# Patient Record
Sex: Female | Born: 1990 | Race: White | Hispanic: No | Smoking: Never smoker
Health system: Southern US, Community
[De-identification: ages and names within clinical notes are randomized; demographics above are authoritative.]

## PROBLEM LIST (undated history)

## (undated) DIAGNOSIS — I639 Cerebral infarction, unspecified: Secondary | ICD-10-CM

## (undated) DIAGNOSIS — R569 Unspecified convulsions: Secondary | ICD-10-CM

## (undated) NOTE — *Deleted (*Deleted)
vEEG LTM started. Neuro notified. tested event button

---

## 2020-04-18 ENCOUNTER — Inpatient Hospital Stay (HOSPITAL_COMMUNITY)
Admission: EM | Admit: 2020-04-18 | Discharge: 2020-04-21 | DRG: 100 | Attending: Pulmonary Disease | Admitting: Pulmonary Disease

## 2020-04-18 ENCOUNTER — Emergency Department (HOSPITAL_COMMUNITY)

## 2020-04-18 ENCOUNTER — Other Ambulatory Visit: Payer: Self-pay

## 2020-04-18 ENCOUNTER — Encounter (HOSPITAL_COMMUNITY): Payer: Self-pay

## 2020-04-18 DIAGNOSIS — Z79899 Other long term (current) drug therapy: Secondary | ICD-10-CM

## 2020-04-18 DIAGNOSIS — S0990XS Unspecified injury of head, sequela: Secondary | ICD-10-CM

## 2020-04-18 DIAGNOSIS — J9601 Acute respiratory failure with hypoxia: Secondary | ICD-10-CM | POA: Diagnosis not present

## 2020-04-18 DIAGNOSIS — Z79891 Long term (current) use of opiate analgesic: Secondary | ICD-10-CM

## 2020-04-18 DIAGNOSIS — E781 Pure hyperglyceridemia: Secondary | ICD-10-CM | POA: Diagnosis present

## 2020-04-18 DIAGNOSIS — E876 Hypokalemia: Secondary | ICD-10-CM | POA: Diagnosis not present

## 2020-04-18 DIAGNOSIS — X58XXXS Exposure to other specified factors, sequela: Secondary | ICD-10-CM | POA: Diagnosis not present

## 2020-04-18 DIAGNOSIS — G40901 Epilepsy, unspecified, not intractable, with status epilepticus: Secondary | ICD-10-CM | POA: Diagnosis present

## 2020-04-18 DIAGNOSIS — R4020 Unspecified coma: Secondary | ICD-10-CM | POA: Diagnosis not present

## 2020-04-18 DIAGNOSIS — Z20822 Contact with and (suspected) exposure to covid-19: Secondary | ICD-10-CM | POA: Diagnosis not present

## 2020-04-18 DIAGNOSIS — L509 Urticaria, unspecified: Secondary | ICD-10-CM | POA: Diagnosis not present

## 2020-04-18 DIAGNOSIS — G40401 Other generalized epilepsy and epileptic syndromes, not intractable, with status epilepticus: Secondary | ICD-10-CM | POA: Diagnosis not present

## 2020-04-18 DIAGNOSIS — Z7982 Long term (current) use of aspirin: Secondary | ICD-10-CM | POA: Diagnosis not present

## 2020-04-18 LAB — BASIC METABOLIC PANEL
Anion gap: 11 (ref 5–15)
BUN: 14 mg/dL (ref 6–20)
CO2: 23 mmol/L (ref 22–32)
Calcium: 8.9 mg/dL (ref 8.9–10.3)
Chloride: 105 mmol/L (ref 98–111)
Creatinine, Ser: 0.58 mg/dL (ref 0.44–1.00)
GFR calc Af Amer: >60 mL/min (ref >60)
GFR calc non Af Amer: >60 mL/min (ref >60)
Glucose, Bld: 101 mg/dL — ABNORMAL HIGH (ref 70–99)
Potassium: 4.3 mmol/L (ref 3.5–5.1)
Sodium: 139 mmol/L (ref 135–145)

## 2020-04-18 LAB — CBC WITH DIFFERENTIAL/PLATELET
Abs Immature Granulocytes: 0.01 10*3/uL (ref 0.00–0.07)
Basophils Absolute: 0 10*3/uL (ref 0.0–0.1)
Basophils Relative: 0 %
Eosinophils Absolute: 0.1 10*3/uL (ref 0.0–0.5)
Eosinophils Relative: 1 %
HCT: 42.9 % (ref 36.0–46.0)
Hemoglobin: 14.4 g/dL (ref 12.0–15.0)
Immature Granulocytes: 0 %
Lymphocytes Relative: 30 %
Lymphs Abs: 2.3 10*3/uL (ref 0.7–4.0)
MCH: 29.4 pg (ref 26.0–34.0)
MCHC: 33.6 g/dL (ref 30.0–36.0)
MCV: 87.6 fL (ref 80.0–100.0)
Monocytes Absolute: 0.7 10*3/uL (ref 0.1–1.0)
Monocytes Relative: 9 %
Neutro Abs: 4.6 10*3/uL (ref 1.7–7.7)
Neutrophils Relative %: 60 %
Platelets: 360 10*3/uL (ref 150–400)
RBC: 4.9 MIL/uL (ref 3.87–5.11)
RDW: 12.5 % (ref 11.5–15.5)
WBC: 7.7 10*3/uL (ref 4.0–10.5)
nRBC: 0 % (ref 0.0–0.2)

## 2020-04-18 LAB — I-STAT BETA HCG BLOOD, ED (MC, WL, AP ONLY): I-stat hCG, quantitative: <5 m[IU]/mL (ref <5)

## 2020-04-18 LAB — BLOOD GAS, ARTERIAL
Acid-Base Excess: 1.6 mmol/L (ref 0.0–2.0)
Bicarbonate: 24.9 mmol/L (ref 20.0–28.0)
FIO2: 100
MECHVT: 500 mL
O2 Saturation: 99.6 %
PEEP: 5 cmH2O
Patient temperature: 99.7
RATE: 16 resp/min
pCO2 arterial: 37.6 mmHg (ref 32.0–48.0)
pH, Arterial: 7.439 (ref 7.350–7.450)
pO2, Arterial: 462 mmHg — ABNORMAL HIGH (ref 83.0–108.0)

## 2020-04-18 LAB — MAGNESIUM: Magnesium: 1.9 mg/dL (ref 1.7–2.4)

## 2020-04-18 MED ORDER — METHYLPREDNISOLONE SODIUM SUCC 125 MG IJ SOLR: 125.0000 mg | Freq: Once | INTRAMUSCULAR | Status: AC

## 2020-04-18 MED ORDER — DIPHENHYDRAMINE HCL 50 MG/ML IJ SOLN: 50.0000 mg | Freq: Once | INTRAMUSCULAR | Status: AC

## 2020-04-18 MED ORDER — LORAZEPAM 2 MG/ML IJ SOLN
INTRAMUSCULAR | Status: AC
  Filled 2020-04-18: qty 1

## 2020-04-18 MED ORDER — ROCURONIUM BROMIDE 10 MG/ML (PF) SYRINGE
PREFILLED_SYRINGE | INTRAVENOUS | Status: AC
  Administered 2020-04-18: 50 mg
  Filled 2020-04-18: qty 10

## 2020-04-18 MED ORDER — LEVETIRACETAM IN NACL 1500 MG/100ML IV SOLN: 1500.0000 mg | Freq: Once | INTRAVENOUS | Status: DC

## 2020-04-18 MED ORDER — PROPOFOL 500 MG/50ML IV EMUL
INTRAVENOUS | Status: AC
  Filled 2020-04-18: qty 50

## 2020-04-18 MED ORDER — MIDAZOLAM HCL 2 MG/2ML IJ SOLN
INTRAMUSCULAR | Status: AC
  Administered 2020-04-19: 4 mg
  Filled 2020-04-18: qty 2

## 2020-04-18 MED ORDER — FAMOTIDINE IN NACL 20-0.9 MG/50ML-% IV SOLN
20.0000 mg | Freq: Once | INTRAVENOUS | Status: AC
  Administered 2020-04-18: 20 mg via INTRAVENOUS
  Filled 2020-04-18: qty 50

## 2020-04-18 MED ORDER — DIPHENHYDRAMINE HCL 50 MG/ML IJ SOLN
INTRAMUSCULAR | Status: AC
  Administered 2020-04-18: 50 mg via INTRAVENOUS
  Filled 2020-04-18: qty 1

## 2020-04-18 MED ORDER — SUCCINYLCHOLINE CHLORIDE 200 MG/10ML IV SOSY
PREFILLED_SYRINGE | INTRAVENOUS | Status: AC
  Administered 2020-04-18: 100 mg
  Filled 2020-04-18: qty 10

## 2020-04-18 MED ORDER — ETOMIDATE 2 MG/ML IV SOLN
INTRAVENOUS | Status: AC
  Administered 2020-04-18: 20 mg
  Filled 2020-04-18: qty 10

## 2020-04-18 MED ORDER — LORAZEPAM 2 MG/ML IJ SOLN: 2.0000 mg | Freq: Once | INTRAMUSCULAR | Status: DC

## 2020-04-18 MED ORDER — METHYLPREDNISOLONE SODIUM SUCC 125 MG IJ SOLR
INTRAMUSCULAR | Status: AC
  Administered 2020-04-18: 125 mg via INTRAVENOUS
  Filled 2020-04-18: qty 2

## 2020-04-18 MED ORDER — LORAZEPAM 2 MG/ML IJ SOLN
2.0000 mg | Freq: Once | INTRAMUSCULAR | Status: AC
  Filled 2020-04-18: qty 1

## 2020-04-18 MED ORDER — LEVETIRACETAM IN NACL 500 MG/100ML IV SOLN
500.0000 mg | Freq: Once | INTRAVENOUS | Status: AC
  Administered 2020-04-18: 500 mg via INTRAVENOUS
  Filled 2020-04-18: qty 100

## 2020-04-18 MED ORDER — MIDAZOLAM HCL 2 MG/2ML IJ SOLN
INTRAMUSCULAR | Status: AC
  Administered 2020-04-18: 2 mg
  Filled 2020-04-18: qty 4

## 2020-04-18 MED ORDER — FENTANYL 2500MCG IN NS 250ML (10MCG/ML) PREMIX INFUSION
0.0000 ug/h | INTRAVENOUS | Status: DC
  Administered 2020-04-18: 25 ug/h via INTRAVENOUS

## 2020-04-18 NOTE — ED Triage Notes (Addendum)
Pt BIB [facility] in custody from jail after a reported seizure. Staff said that they witnessed "2 partial seizures that were isolated to the head." She was given her Keppra less than 1 hour ago. 20g L hand. Hx of seizures.

## 2020-04-18 NOTE — ED Provider Notes (Addendum)
[facility] DE[facility] Provider Note   CSN: [id number] Arrival date & time: 04/18/20  2132     History Chief Complaint  Patient presents with   Seizures    Mary Hines is a 29 y.o. female with a hx of seizures presents to the Emergency Department from jail after being booked several hours ago.  You reported several witnessed seizures -1 only involving the head and the other full tonic-clonic.  She was given 1000 mg of Keppra orally before arriving here.  Level 5 caveat for altered mental status and acuity of condition   The history is provided by the police and medical records (From RN with some history provided by patient prior to her seizure). No language interpreter was used.       History reviewed. No pertinent past medical history.  There are no problems to display for this patient.    OB History   No obstetric history on file.     History reviewed. No pertinent family history.  Social History   Tobacco Use   Smoking status: Not on file  Substance Use Topics   Alcohol use: Not on file   Drug use: Not on file    Home Medications Prior to Admission medications   Medication Sig Start Date End Date Taking? Authorizing Provider  aspirin EC 81 MG tablet Take 81 mg by mouth daily.   Yes [provider]  levETIRAcetam (KEPPRA) 1000 MG tablet Take 1,000 mg by mouth 2 (two) times daily.   Yes [provider]  traMADol (ULTRAM) 50 MG tablet Take 100 mg by mouth every 6 (six) hours as needed.   Yes [provider]    Allergies    Ivp dye [iodinated diagnostic agents] and Penicillins  Review of Systems   Review of Systems  Unable to perform ROS: Acuity of condition  Neurological: Positive for seizures.    Physical Exam Updated Vital Signs BP 115/87    Pulse (!) 104    Temp 99.2 F (37.3 C) (Oral)    Resp 16    Ht _0  (1.626 m)    Wt 65 kg    SpO2 100%    BMI 24.60 kg/m   Physical  Exam Vitals and nursing note reviewed.  Constitutional:      General: She is in acute distress.     Appearance: She is well-developed. She is ill-appearing and diaphoretic.  HENT:     Head: Normocephalic.     Nose: Nose normal.     Mouth/Throat:     Mouth: Mucous membranes are moist.     Pharynx: Oropharynx is clear.  Eyes:     General: No scleral icterus.    Conjunctiva/sclera: Conjunctivae normal.     Comments: Right-sided eye deviation  Cardiovascular:     Rate and Rhythm: Tachycardia present.     Pulses:          Carotid pulses are 2+ on the right side and 2+ on the left side. Pulmonary:     Breath sounds: Normal breath sounds.     Comments: Patient emergently intubated - clear and equal breath sounds after intubation Abdominal:     General: Abdomen is flat. There is no distension.  Lymphadenopathy:     Cervical: No cervical adenopathy.  Skin:    General: Skin is warm.     Capillary Refill: Capillary refill takes less than 2 seconds.     Findings: Rash present.     Comments: Urticarial  rash on face, neck, upper chest, bilateral arms and bilateral legs -occurred approximately 3 to 5 minutes after administration of IV Ativan.  Neurological:     Comments: Active tonic-clonic seizure, status epilepticus     ED Results / Procedures / Treatments   Labs (all labs ordered are listed, but only abnormal results are displayed) Labs Reviewed  BASIC METABOLIC PANEL - Abnormal; Notable for the following components:      Result Value   Glucose, Bld 101 (*)    All other components within normal limits  BLOOD GAS, ARTERIAL - Abnormal; Notable for the following components:   pO2, Arterial 462 (*)    All other components within normal limits  SARS CORONAVIRUS 2 BY RT PCR (HOSPITAL ORDER, [place] [facility])  CBC WITH DIFFERENTIAL/PLATELET  MAGNESIUM  CBG MONITORING, ED  I-STAT BETA HCG BLOOD, ED ([facility], [facility], [facility] ONLY)  I-STAT CHEM 8, ED    Radiology DG Chest  Port 1 View  Result Date: 04/18/2020 CLINICAL DATA:  Status post intubation. EXAM: PORTABLE CHEST 1 VIEW COMPARISON:  None. FINDINGS: An endotracheal tube is seen with its distal tip noted just above the level of the carina. A nasogastric tube is present. Its distal and extends into the body of the stomach. There is no evidence of acute infiltrate, pleural effusion or pneumothorax. The heart size and mediastinal contours are within normal limits. The visualized skeletal structures are unremarkable. IMPRESSION: Endotracheal tube and nasogastric tube positioning, as described above. Electronically Signed   By: Virgina Norfolk M.D.   On: 04/18/2020 23:32    Procedures Procedure Name: Intubation Date/Time: 04/18/2020 11:01 PM Performed by: Abigail Butts, PA-C Oxygen Delivery Method: Non-rebreather mask Preoxygenation: Pre-oxygenation with 100% oxygen Induction Type: Rapid sequence Ventilation: Mask ventilation without difficulty Laryngoscope Size: Mac and 4 Tube size: 7.5 mm Number of attempts: 1 Airway Equipment and Method: Video-laryngoscopy Placement Confirmation: ETT inserted through vocal cords under direct vision,  CO2 detector and Breath sounds checked- equal and bilateral Secured at: 23 cm Tube secured with: ETT holder Dental Injury: Teeth and Oropharynx as per pre-operative assessment     .Critical Care Performed by: Abigail Butts, PA-C Authorized by: Abigail Butts, PA-C   Critical care provider statement:    Critical care time (minutes):  80   Critical care time was exclusive of:  Separately billable procedures and treating other patients   Critical care was necessary to treat or prevent imminent or life-threatening deterioration of the following conditions:  Respiratory failure   Critical care was time spent personally by me on the following activities:  Discussions with consultants, evaluation of patient's response to treatment, examination of patient,  ordering and performing treatments and interventions, ordering and review of laboratory studies, ordering and review of radiographic studies, pulse oximetry, re-evaluation of patient's condition, obtaining history from patient or surrogate and review of old charts   I assumed direction of critical care for this patient from another provider in my specialty: no     (including critical care time)  Medications Ordered in ED Medications  fentaNYL 2535mg in NS 2582m(1055mml) infusion-PREMIX (25 mcg/hr Intravenous New Bag/Given 04/18/20 2312)  propofol (DIPRIVAN) 500 MG/50ML infusion (has no administration in time range)  LORazepam (ATIVAN) injection 2 mg ( Intravenous Not Given 04/18/20 2323)  levETIRAcetam (KEPPRA) IVPB 500 mg/100 mL premix (500 mg Intravenous New Bag/Given 04/18/20 2318)  rocuronium bromide 100 MG/10ML SOSY (50 mg  Given 04/18/20 2258)  etomidate (AMIDATE) 2 MG/ML injection (20 mg  Given 04/18/20 2243)  propofol (DIPRIVAN) 500 MG/50ML infusion (  New Bag/Given 04/18/20 2250)  succinylcholine (ANECTINE) 200 MG/10ML syringe (100 mg  Given 04/18/20 2243)  diphenhydrAMINE (BENADRYL) injection 50 mg (50 mg Intravenous Given 04/18/20 2322)  famotidine (PEPCID) IVPB 20 mg premix (20 mg Intravenous New Bag/Given 04/18/20 2329)  methylPREDNISolone sodium succinate (SOLU-MEDROL) 125 mg/2 mL injection 125 mg (125 mg Intravenous Given 04/18/20 2321)  midazolam (VERSED) 2 MG/2ML injection (4 mg  Given 04/19/20 0000)  midazolam (VERSED) 2 MG/2ML injection (2 mg  Given 04/18/20 2345)    ED Course  I have reviewed the triage vital signs and the nursing notes.  Pertinent labs & imaging results that were available during my care of the patient were reviewed by me and considered in my medical decision making (see chart for details).    MDM Rules/Calculators/A&P                       I was called into the room due to patient seizure.  Tonic-clonic seizure activity lasting greater than 7 minutes  despite 4 mg of Ativan requiring emergent RSI (etomidate and succinylcholine) and intubation.   3 to 5 minutes after Ativan administration patient developed urticarial, bright red erythematous rash to her face, neck, chest, arms and legs.  This was not present before.  Patient additionally given Benadryl, Solu-Medrol and Pepcid.  Rash appears to be improving.  Patient sedated with diprivan and fentanyl drip. Vitals have remained stable.  Since patient was given 1000 mg of Keppra prior to arrival, she will be given additional 500 mg IV here.  BP 115/87    Pulse (!) 104    Temp 99.2 F (37.3 C) (Oral)    Resp 16    Ht _0  (1.626 m)    Wt 65 kg    SpO2 100%    BMI 24.60 kg/m   Patient intubated without complication with Dr. Darl Householder at bedside.  Consult with neurology, Dr. Cheral Marker who request patient be transferred emergently to [facility] for EEG.  He will evaluate when she arrives.  Consult with Zacarias Pontes, EDP Dr. Reather Converse who accepts in transfer.  Consult with PCCM who is aware of patient's transfer, intubation and need to be admitted upon her arrival to [facility].  11:35 PM Patient with additional seizure-like activity.  5 mg of Versed given IV with some improvement.  Diprivan sedation increased.  Initial labs were reassuring.   Chest x-ray shows ET tube just above the carina however somewhat deep.  Tube pulled back 1 cm.  [facility] arrived to transport patient to [facility] prior to head CT.  Head CT will need to be accomplished when she arrives at [facility].  The patient was discussed with and seen by Dr. Darl Householder who agrees with the treatment plan.   Final Clinical Impression(s) / ED Diagnoses Final diagnoses:  Status epilepticus [facility])    Rx / DC Orders ED Discharge Orders    None         Ryelynn Guedea, Gwenlyn Perking 04/19/20 0014    Drenda Freeze, MD 04/21/20 [phone]

## 2020-04-18 NOTE — Progress Notes (Signed)
Report called to charge at [facility], [place].  Patient being transferred to ER for head CT and admission.

## 2020-04-18 NOTE — Progress Notes (Signed)
ETT pulled back to 22 at teeth per order.

## 2020-04-19 ENCOUNTER — Emergency Department (HOSPITAL_COMMUNITY)

## 2020-04-19 ENCOUNTER — Inpatient Hospital Stay (HOSPITAL_COMMUNITY)

## 2020-04-19 DIAGNOSIS — Z79891 Long term (current) use of opiate analgesic: Secondary | ICD-10-CM | POA: Diagnosis not present

## 2020-04-19 DIAGNOSIS — G40901 Epilepsy, unspecified, not intractable, with status epilepticus: Secondary | ICD-10-CM | POA: Diagnosis present

## 2020-04-19 DIAGNOSIS — L509 Urticaria, unspecified: Secondary | ICD-10-CM | POA: Diagnosis present

## 2020-04-19 DIAGNOSIS — S0990XS Unspecified injury of head, sequela: Secondary | ICD-10-CM | POA: Diagnosis not present

## 2020-04-19 DIAGNOSIS — Z79899 Other long term (current) drug therapy: Secondary | ICD-10-CM | POA: Diagnosis not present

## 2020-04-19 DIAGNOSIS — E876 Hypokalemia: Secondary | ICD-10-CM | POA: Diagnosis not present

## 2020-04-19 DIAGNOSIS — R0689 Other abnormalities of breathing: Secondary | ICD-10-CM | POA: Insufficient documentation

## 2020-04-19 DIAGNOSIS — R4020 Unspecified coma: Secondary | ICD-10-CM | POA: Diagnosis not present

## 2020-04-19 DIAGNOSIS — E781 Pure hyperglyceridemia: Secondary | ICD-10-CM | POA: Diagnosis present

## 2020-04-19 DIAGNOSIS — Z20822 Contact with and (suspected) exposure to covid-19: Secondary | ICD-10-CM | POA: Diagnosis present

## 2020-04-19 DIAGNOSIS — Z7982 Long term (current) use of aspirin: Secondary | ICD-10-CM | POA: Diagnosis not present

## 2020-04-19 DIAGNOSIS — X58XXXS Exposure to other specified factors, sequela: Secondary | ICD-10-CM | POA: Diagnosis present

## 2020-04-19 DIAGNOSIS — G40401 Other generalized epilepsy and epileptic syndromes, not intractable, with status epilepticus: Secondary | ICD-10-CM | POA: Diagnosis present

## 2020-04-19 DIAGNOSIS — J9601 Acute respiratory failure with hypoxia: Secondary | ICD-10-CM | POA: Diagnosis present

## 2020-04-19 LAB — CBC
HCT: 38.5 % (ref 36.0–46.0)
Hemoglobin: 12.8 g/dL (ref 12.0–15.0)
MCH: 29.2 pg (ref 26.0–34.0)
MCHC: 33.2 g/dL (ref 30.0–36.0)
MCV: 87.7 fL (ref 80.0–100.0)
Platelets: 298 10*3/uL (ref 150–400)
RBC: 4.39 MIL/uL (ref 3.87–5.11)
RDW: 12.2 % (ref 11.5–15.5)
WBC: 6 10*3/uL (ref 4.0–10.5)
nRBC: 0 % (ref 0.0–0.2)

## 2020-04-19 LAB — RAPID URINE DRUG SCREEN, HOSP PERFORMED
Amphetamines: POSITIVE — AB
Barbiturates: NOT DETECTED
Benzodiazepines: POSITIVE — AB
Cocaine: NOT DETECTED
Opiates: NOT DETECTED
Tetrahydrocannabinol: POSITIVE — AB

## 2020-04-19 LAB — HIV ANTIBODY (ROUTINE TESTING W REFLEX): HIV Screen 4th Generation wRfx: NONREACTIVE

## 2020-04-19 LAB — CREATININE, SERUM
Creatinine, Ser: 0.53 mg/dL (ref 0.44–1.00)
GFR calc Af Amer: >60 mL/min (ref >60)
GFR calc non Af Amer: >60 mL/min (ref >60)

## 2020-04-19 LAB — MRSA PCR SCREENING: MRSA by PCR: NEGATIVE

## 2020-04-19 LAB — SARS CORONAVIRUS 2 BY RT PCR (HOSPITAL ORDER, PERFORMED IN ~~LOC~~ HOSPITAL LAB): SARS Coronavirus 2: NEGATIVE

## 2020-04-19 LAB — TRIGLYCERIDES: Triglycerides: 553 mg/dL — ABNORMAL HIGH (ref <150)

## 2020-04-19 MED ORDER — POLYETHYLENE GLYCOL 3350 17 G PO PACK: 17.0000 g | PACK | Freq: Every day | ORAL | Status: DC | PRN

## 2020-04-19 MED ORDER — DOCUSATE SODIUM 50 MG/5ML PO LIQD: 100.0000 mg | Freq: Two times a day (BID) | ORAL | Status: DC

## 2020-04-19 MED ORDER — PROPOFOL 1000 MG/100ML IV EMUL
INTRAVENOUS | Status: AC
  Filled 2020-04-19: qty 100

## 2020-04-19 MED ORDER — PROPOFOL 1000 MG/100ML IV EMUL
0.0000 ug/kg/min | INTRAVENOUS | Status: DC
  Administered 2020-04-19: 20 ug/kg/min via INTRAVENOUS
  Administered 2020-04-20: 40 ug/kg/min via INTRAVENOUS
  Administered 2020-04-20: 20 ug/kg/min via INTRAVENOUS
  Filled 2020-04-19 (×2): qty 100

## 2020-04-19 MED ORDER — FENTANYL CITRATE (PF) 100 MCG/2ML IJ SOLN: 50.0000 ug | Freq: Once | INTRAMUSCULAR | Status: DC

## 2020-04-19 MED ORDER — LORAZEPAM 2 MG/ML IJ SOLN
INTRAMUSCULAR | Status: AC
  Administered 2020-04-19: 2 mg
  Filled 2020-04-19: qty 1

## 2020-04-19 MED ORDER — FENTANYL 2500MCG IN NS 250ML (10MCG/ML) PREMIX INFUSION
50.0000 ug/h | INTRAVENOUS | Status: DC
  Administered 2020-04-19: 50 ug/h via INTRAVENOUS

## 2020-04-19 MED ORDER — DOCUSATE SODIUM 50 MG/5ML PO LIQD: 100.0000 mg | Freq: Two times a day (BID) | ORAL | Status: DC | PRN

## 2020-04-19 MED ORDER — PANTOPRAZOLE SODIUM 40 MG IV SOLR
40.0000 mg | Freq: Every day | INTRAVENOUS | Status: DC
  Administered 2020-04-19 – 2020-04-20 (×2): 40 mg via INTRAVENOUS
  Filled 2020-04-19 (×2): qty 40

## 2020-04-19 MED ORDER — FENTANYL BOLUS VIA INFUSION
50.0000 ug | INTRAVENOUS | Status: DC | PRN
  Filled 2020-04-19: qty 50

## 2020-04-19 MED ORDER — PROPOFOL 1000 MG/100ML IV EMUL
0.0000 ug/kg/min | INTRAVENOUS | Status: DC
  Administered 2020-04-19: 40 ug/kg/min via INTRAVENOUS

## 2020-04-19 MED ORDER — POLYETHYLENE GLYCOL 3350 17 G PO PACK: 17.0000 g | PACK | Freq: Every day | ORAL | Status: DC

## 2020-04-19 MED ORDER — CHLORHEXIDINE GLUCONATE CLOTH 2 % EX PADS
6.0000 | MEDICATED_PAD | Freq: Every day | CUTANEOUS | Status: DC
  Administered 2020-04-20 (×2): 6 via TOPICAL

## 2020-04-19 MED ORDER — MIDAZOLAM HCL 2 MG/2ML IJ SOLN
2.0000 mg | INTRAMUSCULAR | Status: DC | PRN
  Administered 2020-04-20: 2 mg via INTRAVENOUS
  Filled 2020-04-19: qty 2

## 2020-04-19 MED ORDER — LEVETIRACETAM IN NACL 1000 MG/100ML IV SOLN
1000.0000 mg | Freq: Two times a day (BID) | INTRAVENOUS | Status: DC
  Administered 2020-04-19 – 2020-04-21 (×6): 1000 mg via INTRAVENOUS
  Filled 2020-04-19 (×6): qty 100

## 2020-04-19 MED ORDER — ORAL CARE MOUTH RINSE
15.0000 mL | OROMUCOSAL | Status: DC
  Administered 2020-04-19 – 2020-04-20 (×11): 15 mL via OROMUCOSAL

## 2020-04-19 MED ORDER — DOCUSATE SODIUM 100 MG PO CAPS: 100.0000 mg | ORAL_CAPSULE | Freq: Two times a day (BID) | ORAL | Status: DC | PRN

## 2020-04-19 MED ORDER — ONDANSETRON HCL 4 MG/2ML IJ SOLN
4.0000 mg | Freq: Four times a day (QID) | INTRAMUSCULAR | Status: DC | PRN
  Administered 2020-04-20: 4 mg via INTRAVENOUS

## 2020-04-19 MED ORDER — MIDAZOLAM HCL 2 MG/2ML IJ SOLN: 2.0000 mg | INTRAMUSCULAR | Status: DC | PRN

## 2020-04-19 MED ORDER — HEPARIN SODIUM (PORCINE) 5000 UNIT/ML IJ SOLN
5000.0000 [IU] | Freq: Three times a day (TID) | INTRAMUSCULAR | Status: DC
  Administered 2020-04-19 – 2020-04-21 (×6): 5000 [IU] via SUBCUTANEOUS
  Filled 2020-04-19 (×7): qty 1

## 2020-04-19 MED ORDER — CHLORHEXIDINE GLUCONATE 0.12% ORAL RINSE (MEDLINE KIT)
15.0000 mL | Freq: Two times a day (BID) | OROMUCOSAL | Status: DC
  Administered 2020-04-19 – 2020-04-20 (×3): 15 mL via OROMUCOSAL

## 2020-04-19 MED ORDER — DOCUSATE SODIUM 50 MG/5ML PO LIQD
100.0000 mg | Freq: Two times a day (BID) | ORAL | Status: DC
  Filled 2020-04-19: qty 10

## 2020-04-19 NOTE — Progress Notes (Signed)
Attempted to wean patient per MD. Patient RR less than 8 on 12/5. Will attempt again once sedation is off longer.

## 2020-04-19 NOTE — Progress Notes (Signed)
1444 RN at bedside, pt eyes noted to roll back and all over slight shaking. MD Agarwala on unit to bedside to evaluate.  Paged MD Aroor to make aware.

## 2020-04-19 NOTE — Progress Notes (Signed)
Patient transported from ED Trauma A to 4N27 with no complications.

## 2020-04-19 NOTE — Procedures (Signed)
Electroencephalogram.  Continuous video EEG monitoring < 12 hrs   CPT 95718   Recording begins 04/19/2020 at 0200 Recording ends 04/19/2020 at 0700   4 hrs   Day 1  Date acquisition: [employer] 10-20 for electrode placement.  18 channels EEG with additional eyes linked to ipsilateral ears and EKG.  Continuous video monitoring.  This EEG was requested for this patient was witnessed generalized shaking and to evaluate for clinical and subclinical seizures  Background activities marked by continuous delta slowing with superimposed faster frequencies in the beta range. There was no clinical subclinical seizures. Possibly slight lack of faster frequencies across right temporal region however asymmetries fairly mild.  Otherwise no interictal epileptiform discharges present  Clinical interpretation: This 4 hours of intensive EEG monitoring with simultaneous video monitoring did not record any clinical subclinical seizures.  Background activities marked by continuous delta slowing with superimposed faster frequencies suggestive of encephalopathy of nonspecific etiologies.  Sedation status may contribute to these findings if patient is sedated.  There is a relative lack of faster frequencies in the right temporal region however asymmetry is very mild this may be suggestive of some degree of neuronal dysfunction on a structural vascular or degenerative basis in the right temporal region.  Again findings are very mild and recording is very limited. No evidence of cortical irritability.  Continuous monitoring is recommended to evaluate for clinical and subclinical seizures.  Clinical correlation is advised.

## 2020-04-19 NOTE — Progress Notes (Addendum)
Seizure activity noted of all over body shaking; 1311-1313 with downward gaze, pt extremities rigid. Floor RN pulled and gave 2mg  ativan 1312, rash of neck noted but quickly subsided. MD Agarwala paged-- order to again initiate propofol. MD Aroor paged and made aware--to review EEG and make adjustments if needed.  1318 propofol initiated @20mcg   1348 pt responding to voice and following commands

## 2020-04-19 NOTE — Progress Notes (Signed)
Stat EEG completed, results pending.  Dr Otelia Limes aware. Initiated LTM per his order. RN made aware. No skin breakdown noted.

## 2020-04-19 NOTE — Progress Notes (Signed)
NAME:  Mary Hines, MRN:  [id number], DOB:  [birth year], LOS: 0 ADMISSION DATE:  04/18/2020, CONSULTATION DATE: 04/19/2020 REFERRING MD: ED physician, CHIEF COMPLAINT: Status epilepticus   History of present illness   29 year old lady with history of seizure disorder who was being transferred from a jail to another and then started having seizure was brought into the emergency room at [facility] where she had another tonic-clonic seizure was given Ativan and was intubated for airway protection then she developed some diffuse rash requiring steroids and Benadryl. Neurology were contacted and patient was started on dipper Van fentanyl drip and was given Keppra and was advised to be transferred to [facility] for further management and EEG. Later in the night she developed another tonic-clonic seizure and was given 5 mg of IV Versed with improvement in her seizure. Patient was transferred to [facility] emergency room where we were called and I came to assess the patient. Patient was sedated with fentanyl and propofol and she is being connected to the EEG monitor. No obvious seizures. I was not able to get any history from the patient due to her current condition.  Past Medical History  Seizure disorder  Imaging: CT head: no acute intracranial finding.  EEG 5/24 > No sz seen. Slowing likely related to sedation.   INTERVAL HISTORY No acute events overnight. Remains on vent and EEG monitoring.   Objective   Blood pressure 95/64, pulse 65, temperature 98 F (36.7 C), temperature source Axillary, resp. rate 16, height 5\' 4"  (1.626 m), weight 65 kg, SpO2 100 %.    Vent Mode: PRVC FiO2 (%):  [40 %-100 %] 40 % Set Rate:  [16 bmp] 16 bmp Vt Set:  [440 mL-500 mL] 440 mL PEEP:  [5 cmH20] 5 cmH20 Plateau Pressure:  [12 cmH20-16 cmH20] 16 cmH20   Intake/Output Summary (Last 24 hours) at 04/19/2020 0946 Last data filed at 04/19/2020 0800 Gross per 24 hour  Intake 129.82 ml  Output 250 ml  Net  -120.18 ml   Filed Weights   04/18/20 2245  Weight: 65 kg    Examination: General: Young adult female on vent.  HENT: [place]/AT, PERRL, no JVD Lungs: Clear bilateral breath sounds Cardiovascular: RRR, no MRG Abdomen: Soft, NT, ND Extremities: No edema Neuro: Sedated   Assessment & Plan:   Status epilepticus: - AEDs per neuro - home Keppra - cEEG  Acute hypoxemic respiratory failure: -Full vent support -Fentanyl infusion for RASS 0 -DC propofol for WUA and hypertriglyceridemia (553) -VAP bundle -OK from neuro to wake up and wean vent.   Substance abuse: UDS positive for amphetamines, THC, and benzo(likley from inpatient admit) - Supportive care - Rehabilitation assistance at the proper juncture.   Best practice: -Heparin subcu and SCDs for DVT prophylaxis -PPI for GI prophylaxis -NPO -Full code -No family info   Labs   CBC: Recent Labs  Lab 04/18/20 2234 04/19/20 0340  WBC 7.7 6.0  NEUTROABS 4.6  --   HGB 14.4 12.8  HCT 42.9 38.5  MCV 87.6 87.7  PLT 360 298    Basic Metabolic Panel: Recent Labs  Lab 04/18/20 2234 04/18/20 2249 04/19/20 0340  NA 139  --   --   K 4.3  --   --   CL 105  --   --   CO2 23  --   --   GLUCOSE 101*  --   --   BUN 14  --   --   CREATININE 0.58  --  0.53  CALCIUM 8.9  --   --   MG  --  1.9  --    GFR: Estimated Creatinine Clearance: 90.4 mL/min (by C-G formula based on SCr of 0.53 mg/dL). Recent Labs  Lab 04/18/20 2234 04/19/20 0340  WBC 7.7 6.0    Liver Function Tests: No results for input(s): AST, ALT, ALKPHOS, BILITOT, PROT, ALBUMIN in the last 168 hours. No results for input(s): LIPASE, AMYLASE in the last 168 hours. No results for input(s): AMMONIA in the last 168 hours.  ABG    Component Value Date/Time   PHART 7.439 04/18/2020 2328   PCO2ART 37.6 04/18/2020 2328   PO2ART 462 (H) 04/18/2020 2328   HCO3 24.9 04/18/2020 2328   O2SAT 99.6 04/18/2020 2328     Coagulation Profile: No results for  input(s): INR, PROTIME in the last 168 hours.  Cardiac Enzymes: No results for input(s): CKTOTAL, CKMB, CKMBINDEX, TROPONINI in the last 168 hours.  HbA1C: No results found for: HGBA1C  CBG: No results for input(s): GLUCAP in the last 168 hours.  Review of Systems:   Could not be obtained due to sedation  Past Medical History  She,  has no past medical history on file.    Social History    Could not be obtained due to sedation  Family History   Her family history is not on file. Could not be obtained due to sedation  Allergies Allergies  Allergen Reactions  . Ivp Dye [Iodinated Diagnostic Agents] Hives  . Penicillins Hives  . Ativan [Lorazepam] Hives     Home Medications  Prior to Admission medications   Medication Sig Start Date End Date Taking? Authorizing Provider  aspirin EC 81 MG tablet Take 81 mg by mouth daily.   Yes [provider]  levETIRAcetam (KEPPRA) 1000 MG tablet Take 1,000 mg by mouth 2 (two) times daily.   Yes [provider]  traMADol (ULTRAM) 50 MG tablet Take 100 mg by mouth every 6 (six) hours as needed.   Yes [provider]     Critical care time 35 mins needed due to respiratory failure on vent, status epilepticus.    Georgann Housekeeper, AGACNP-BC [place]  See Amion for personal pager PCCM on call pager [phone]  04/19/2020 9:57 AM

## 2020-04-19 NOTE — H&P (Signed)
NAME:  Mary Hines, MRN:  [id number], DOB:  1991-03-13, LOS: 0 ADMISSION DATE:  04/18/2020, CONSULTATION DATE: 04/19/2020 REFERRING MD: ED physician, CHIEF COMPLAINT: Status epilepticus   History of present illness   29 year old lady with history of seizure disorder who was being transferred from a jail to another and then started having seizure was brought into the emergency room at [facility] where she had another tonic-clonic seizure was given Ativan and was intubated for airway protection then she developed some diffuse rash requiring steroids and Benadryl. Neurology were contacted and patient was started on dipper Van fentanyl drip and was given Keppra and was advised to be transferred to [facility] for further management and EEG. Later in the night she developed another tonic-clonic seizure and was given 5 mg of IV Versed with improvement in her seizure. Patient was transferred to [facility] emergency room where we were called and I came to assess the patient. Patient was sedated with fentanyl and propofol and she is being connected to the EEG monitor. No obvious seizures. I was not able to get any history from the patient due to her current condition.  Past Medical History  Seizure disorder    Objective   Blood pressure 99/62, pulse 76, temperature 98.4 F (36.9 C), temperature source Temporal, resp. rate 16, height 5\' 4"  (1.626 m), weight 65 kg, SpO2 99 %.    Vent Mode: PRVC FiO2 (%):  [100 %] 100 % Set Rate:  [16 bmp] 16 bmp Vt Set:  [440 mL-500 mL] 440 mL PEEP:  [5 cmH20] 5 cmH20 Plateau Pressure:  [12 cmH20-13 cmH20] 12 cmH20  No intake or output data in the 24 hours ending 04/19/20 0257 Filed Weights   04/18/20 2245  Weight: 65 kg    Examination: General: Sedated acutely ill on mechanical ventilation HENT: Orally intubated Lungs: Clear equal air sounds bilateral no crackles no wheezing Cardiovascular: Normal heart sound no added sounds or murmurs Abdomen: Soft  no tenderness no guarding Extremities: No edema Neuro: Sedated not following commands pupils reactive bilaterally    Assessment & Plan:   Assessment: -Status epilepticus -Acute respiratory sufficiency requiring mechanical ventilation for airway protection  Plan: -Adjust mechanical ventilation use lactic to strategy keep pulse ox above 92% -Sedated with propofol and fentanyl -Use as needed Versed if needed -Scheduled IV Keppra -We appreciate neurology follow-up and input -Continuous EEG -CT of the head -Keep n.p.o. -Heparin subcu and SCDs for DVT prophylaxis -PPI for GI prophylaxis -Full code   Labs   CBC: Recent Labs  Lab 04/18/20 2234  WBC 7.7  NEUTROABS 4.6  HGB 14.4  HCT 42.9  MCV 87.6  PLT 360    Basic Metabolic Panel: Recent Labs  Lab 04/18/20 2234 04/18/20 2249  NA 139  --   K 4.3  --   CL 105  --   CO2 23  --   GLUCOSE 101*  --   BUN 14  --   CREATININE 0.58  --   CALCIUM 8.9  --   MG  --  1.9   GFR: Estimated Creatinine Clearance: 90.4 mL/min (by C-G formula based on SCr of 0.58 mg/dL). Recent Labs  Lab 04/18/20 2234  WBC 7.7    Liver Function Tests: No results for input(s): AST, ALT, ALKPHOS, BILITOT, PROT, ALBUMIN in the last 168 hours. No results for input(s): LIPASE, AMYLASE in the last 168 hours. No results for input(s): AMMONIA in the last 168 hours.  ABG  Component Value Date/Time   PHART 7.439 04/18/2020 2328   PCO2ART 37.6 04/18/2020 2328   PO2ART 462 (H) 04/18/2020 2328   HCO3 24.9 04/18/2020 2328   O2SAT 99.6 04/18/2020 2328     Coagulation Profile: No results for input(s): INR, PROTIME in the last 168 hours.  Cardiac Enzymes: No results for input(s): CKTOTAL, CKMB, CKMBINDEX, TROPONINI in the last 168 hours.  HbA1C: No results found for: HGBA1C  CBG: No results for input(s): GLUCAP in the last 168 hours.  Review of Systems:   Could not be obtained due to sedation  Past Medical History  She,  has no past  medical history on file.    Social History    Could not be obtained due to sedation  Family History   Her family history is not on file. Could not be obtained due to sedation  Allergies Allergies  Allergen Reactions  . Ivp Dye [Iodinated Diagnostic Agents] Hives  . Penicillins Hives  . Ativan [Lorazepam] Hives     Home Medications  Prior to Admission medications   Medication Sig Start Date End Date Taking? Authorizing Provider  aspirin EC 81 MG tablet Take 81 mg by mouth daily.   Yes [provider]  levETIRAcetam (KEPPRA) 1000 MG tablet Take 1,000 mg by mouth 2 (two) times daily.   Yes [provider]  traMADol (ULTRAM) 50 MG tablet Take 100 mg by mouth every 6 (six) hours as needed.   Yes [provider]     Critical care time: I have spent 35 minutes of critical care time this time was spent at bedside or in the unit this time was exclusive any billable procedures patient is needing intensive care due to complex medical problem requiring complex medical decisions.

## 2020-04-19 NOTE — Progress Notes (Signed)
Reason for consult: Status epilepticus  Subjective: Patient is sedated and intubated.  Moves all 4 extremities to noxious stimulus.   ROS:  Unable to obtain due to poor mental status  Examination  Vital signs in last 24 hours: Temp:  [97.4 F (36.3 C)-99.2 F (37.3 C)] 98 F (36.7 C) (05/24 0800) Pulse Rate:  [64-106] 65 (05/24 0900) Resp:  [16] 16 (05/24 0900) BP: (90-117)/(61-89) 95/64 (05/24 0800) SpO2:  [98 %-100 %] 100 % (05/24 0805) FiO2 (%):  [40 %-100 %] 40 % (05/24 0805) Weight:  [65 kg] 65 kg (05/23 2245)   General: lying in bed CVS: pulse-normal rate and rhythm RS: breathing comfortably Extremities: normal   Neuro: Mental Status: Patient intubated and sedated, nonverbal and not following commands Cranial Nerves: II: Pupils are equal and reactive III,IV,VI: doll's response present V,VII: corneal reflex: Present VIII: patient does not respond to verbal stimuli IX,X: gag reflex present XI: trapezius strength unable to test bilaterally XII: tongue strength unable to test Motor: Withdraws in all 4 extremities Sensory: Withdraws to pain bilaterally Plantars: Flexor Cerebellar: Unable to perform Gait: not tested  Basic Metabolic Panel: Recent Labs  Lab 04/18/20 2234 04/18/20 2249 04/19/20 0340  NA 139  --   --   K 4.3  --   --   CL 105  --   --   CO2 23  --   --   GLUCOSE 101*  --   --   BUN 14  --   --   CREATININE 0.58  --  0.53  CALCIUM 8.9  --   --   MG  --  1.9  --     CBC: Recent Labs  Lab 04/18/20 2234 04/19/20 0340  WBC 7.7 6.0  NEUTROABS 4.6  --   HGB 14.4 12.8  HCT 42.9 38.5  MCV 87.6 87.7  PLT 360 298     Coagulation Studies: No results for input(s): LABPROT, INR in the last 72 hours.  Imaging Reviewed:     ASSESSMENT AND PLAN  29 year old female with history of seizures on Keppra presented to [facility] emergency department after having witnessed seizures while in jail.  While in the ED patient had tonic-clonic  seizure activity lasting greater than 7 minutes despite 4 mg of Ativan and patient was intubated for airway protection. Transferred to [facility], ICU-and placed on long-term EEG monitoring.  Reviewed LTM EEG-no further seizures.  Status epilepticus: Resolved  Recommendations  Wean sedation as much as possible and extubate Continue Keppra 1000 mg twice daily Seizure precautions Continue EEG while patient still intubated on sedation  CRITICAL CARE Performed by: Dara Lords Elliott Lasecki   Total critical care time: 35  minutes  Critical care time was exclusive of separately billable procedures and treating other patients.  Critical care was necessary to treat or prevent imminent or life-threatening deterioration.  Critical care was time spent personally by me on the following activities: development of treatment plan with patient and/or surrogate as well as nursing, discussions with consultants, evaluation of patient's response to treatment, examination of patient, obtaining history from patient or surrogate, ordering and performing treatments and interventions, ordering and review of laboratory studies, ordering and review of radiographic studies, pulse oximetry and re-evaluation of patient's condition.  Georgiana Spinner Navaeh Kehres [facility] Pager Number [phone] For questions after 7pm please refer to AMION to reach the Neurologist on call

## 2020-04-19 NOTE — ED Notes (Addendum)
EEG being conducted 

## 2020-04-19 NOTE — ED Provider Notes (Signed)
Patient accepted in transfer from [facility] after treatment, including intubation, for status epilepticus. On arrival, patient is appropriately sedated, BP 116/70. Per chart, she did not receive her CT head prior to transfer and will go to CT now. Dr. Otelia Limes, neuro, paged and is aware patient has arrived.  Plan: Head CT, EEG per neuro consult from [place].   2:00 - Discussed patient care with Dr. Otelia Limes. EEG being obtained now. He does not feel MRI is indicated. He is managing Keppra dosing. PCCM paged to admit.   2:20 - PCCM to see and admit.   Elpidio Anis, PA-C 04/19/20 Bufford Spikes, MD 04/19/20 [phone]

## 2020-04-19 NOTE — Procedures (Signed)
Patient Name: Ronelle Nigh  MRN: [id number]  Epilepsy Attending: Charlsie Quest  Referring Physician/Provider: Dr Caryl Pina Date: 04/19/2020  Duration: 24.24 mins  Patient history: 29 year old female with known history of seizure presented with breakthrough seizures manifesting as status epilepticus.  EEG evaluate for seizures.  Level of alertness: Comatose  AEDs during EEG study: Propofol, Keppra, Ativan  Technical aspects: This EEG study was done with scalp electrodes positioned according to the 10-20 International system of electrode placement. Electrical activity was acquired at a sampling rate of 500Hz  and reviewed with a high frequency filter of 70Hz  and a low frequency filter of 1Hz . EEG data were recorded continuously and digitally stored.   Description: EEG showed continuous generalized 3 to 5 Hz theta-delta slowing with overriding 15 to 18 Hz, 2-3 uV beta activity with irregular morphology distributed symmetrically and diffusely. Hyperventilation and photic stimulation were not performed.     ABNORMALITY -Excessive beta, generalized -Continuous slow, generalized  IMPRESSION: This study is suggestive of severe diffuse encephalopathy, nonspecific etiology but most likely related to sedation. The excessive beta activity seen in the background is most likely due to the effect of benzodiazepine and is a benign EEG pattern. No seizures or epileptiform discharges were seen throughout the recording.    Shawnta Zimbelman 

## 2020-04-19 NOTE — ED Triage Notes (Addendum)
Pt arrived by [facility] as [facility] tx for status epilepticus; intubated, propofol and fentanyl infusing. Per report, hx of seizures, is supposed to be taking keppra. Pt was transferring to another correctional facility when she started seizing. Pt reaching for tube on arrival, sedation increased. Pt was also given ativan for seizures and broke out in hives (solumedrol, benadryl, and pepcid given pta)

## 2020-04-19 NOTE — Progress Notes (Signed)
Patient awake breathing 33 times a minute when agitated. When patient calms down, RR drops below 8. Patient switched back to full support. Continuous EEG is running. RT will continue to monitor.

## 2020-04-19 NOTE — Progress Notes (Signed)
Patient transported from ED Trauma A to CT and back with no complications. 

## 2020-04-19 NOTE — Consult Note (Addendum)
NEURO HOSPITALIST CONSULT NOTE   Requesting physician: Dr. Roxanne Mins  Reason for Consult: Status epilepticus  History obtained from:  Chart    HPI:                                                                                                                                          Mary Hines is an 29 y.o. female with a history of seizures, on Keppra, who presented to the [facility] ED from jail, after having had a seizure there several hours after being booked. Staff had witnessed "2 partial seizures that were isolated to the head" as well as a full tonic-clonic seizure. She was given 1000 mg Keppra at the [place], but unclear if this was prior to or after her initial seizures.    Seizure recurred while in the ED. Per EDP note: "I was called into the room due to patient seizure.  Tonic-clonic seizure activity lasting greater than 7 minutes despite 4 mg of Ativan requiring emergent RSI (etomidate and succinylcholine) and intubation. 3 to 5 minutes after Ativan administration patient developed urticarial, bright red erythematous rash to her face, neck, chest, arms and legs.  This was not present before.  Patient additionally given Benadryl, Solu-Medrol and Pepcid.  Rash appears to be improving. Patient sedated with diprivan and fentanyl drip. Vitals have remained stable.  Since patient was given 1000 mg of Keppra prior to arrival, she will be given additional 500 mg IV here."  At 11:35 PM she had additional seizure-like activity. 5 mg of Versed given IV with some improvement.  Diprivan sedation was increased.  Initial labs were reassuring. Na 139, Ca 8.9, Mg 1.9 with normal eGFR and normal white count. HCG was < 5.   After arrival to [facility] a CT head was obtained revealing no acute intracranial findings.    Per Epic, her home meds are as follows: Keppra 1000 mg po BID ASA 81 mg po qd Ultram 100 mg po q6h PRN  History reviewed. No pertinent past medical  history.  History reviewed. No pertinent family history.            Social History:  has no history on file for tobacco, alcohol, and drug.  Allergies  Allergen Reactions  . Ivp Dye [Iodinated Diagnostic Agents] Hives  . Penicillins Hives  . Ativan [Lorazepam] Hives    HOME MEDICATIONS:  No current facility-administered medications on file prior to encounter.   Current Outpatient Medications on File Prior to Encounter  Medication Sig Dispense Refill  . aspirin EC 81 MG tablet Take 81 mg by mouth daily.    Marland Kitchen levETIRAcetam (KEPPRA) 1000 MG tablet Take 1,000 mg by mouth 2 (two) times daily.    . traMADol (ULTRAM) 50 MG tablet Take 100 mg by mouth every 6 (six) hours as needed.       ROS:                                                                                                                                       Unable to obtain due to intubation and sedation.   Blood pressure 102/61, pulse 78, temperature 98.4 F (36.9 C), temperature source Temporal, resp. rate 16, height 5' 4"  (1.626 m), weight 65 kg, SpO2 98 %.   General Examination:                                                                                                       Physical Exam  HEENT-  [place]/AT    Lungs- Intubated Extremities- No edema  Neurological Examination Mental Status: Intubated and sedated on 60 mcg/kg/hr of propofol. Will extend upper extremities to noxious stimuli. No purposeful movements. No eye opening to noxious. No attempts to communicate. Not responding to voice.  Cranial Nerves: II: No blink to threat. Pupils 2 mm and unreactive.   III,IV, VI: Eyes conjugately at the midline. No doll's eye reflex.  V,VII: Face flaccidly symmetric. Does not respond to tactile stimuli VIII: No response to voice IX,X: Intubated XI: Head is midline XII:  Intubated Motor/Sensory: Flaccid tone x 4. Extends upper extremities weakly to arm pinch. No withdrawal to plantar stimulation bilaterally.  Deep Tendon Reflexes:  2+ biceps and brachioradialis bilaterally.  3+ patellae, 2+ achilles bilaterally.  Toes mute bilaterally.  Cerebellar/Gait: Unable to assess   Lab Results: Basic Metabolic Panel: Recent Labs  Lab 04/18/20 2234 04/18/20 2249  NA 139  --   K 4.3  --   CL 105  --   CO2 23  --   GLUCOSE 101*  --   BUN 14  --   CREATININE 0.58  --   CALCIUM 8.9  --   MG  --  1.9    CBC: Recent Labs  Lab 04/18/20 2234  WBC 7.7  NEUTROABS 4.6  HGB 14.4  HCT 42.9  MCV 87.6  PLT 360  Cardiac Enzymes: No results for input(s): CKTOTAL, CKMB, CKMBINDEX, TROPONINI in the last 168 hours.  Lipid Panel: No results for input(s): CHOL, TRIG, HDL, CHOLHDL, VLDL, LDLCALC in the last 168 hours.  Imaging: DG Chest Port 1 View  Result Date: 04/18/2020 CLINICAL DATA:  Status post intubation. EXAM: PORTABLE CHEST 1 VIEW COMPARISON:  None. FINDINGS: An endotracheal tube is seen with its distal tip noted just above the level of the carina. A nasogastric tube is present. Its distal and extends into the body of the stomach. There is no evidence of acute infiltrate, pleural effusion or pneumothorax. The heart size and mediastinal contours are within normal limits. The visualized skeletal structures are unremarkable. IMPRESSION: Endotracheal tube and nasogastric tube positioning, as described above. Electronically Signed   By: Virgina Norfolk M.D.   On: 04/18/2020 23:32    Assessment: 29 year old female with known seizure history presenting with breakthrough seizures manifesting as status epilepticus 1. Clinical exam findings are consistent with propofol sedation. No lateralizing findings or clinical seizure activity seen.  2. Spot EEG reveals no electrographic seizures. Beta activity is diffusely present, consistent with benzodiazepine  administration. Diffuse slowing is noted.   Recommendations: 1. Increase scheduled Keppra to 1000 mg IV BID, first dose now (ordered).  2. STAT EEG has been completed (see above). Converting to LTM.  3. Frequent neuro checks.  4. When the patient regains consciousness, will need to be interviewed to assess for compliance vs noncompliance with home Keppra regimen. Also will need to be counseled regarding no driving until seizure free for 6 months, and other restrictions. Will need to determine if she is withdrawing from benzodiazepines or EtOH, and also will need to interview patient regarding possible illicit drug use, which could have lowered her seizure threshold.  5. Tox screen (ordered).  6. Gradually wean off propofol starting in the AM.  7. Discontinue Ultram. This medication can lower the seizure threshold.   40 minutes spent in the emergent neurological evaluation and management of this critically ill patient.   Electronically signed: Dr. Kerney Elbe 04/19/2020, 1:32 AM

## 2020-04-20 DIAGNOSIS — J9601 Acute respiratory failure with hypoxia: Secondary | ICD-10-CM

## 2020-04-20 DIAGNOSIS — R569 Unspecified convulsions: Secondary | ICD-10-CM

## 2020-04-20 LAB — BASIC METABOLIC PANEL
Anion gap: 12 (ref 5–15)
BUN: 21 mg/dL — ABNORMAL HIGH (ref 6–20)
CO2: 23 mmol/L (ref 22–32)
Calcium: 8.8 mg/dL — ABNORMAL LOW (ref 8.9–10.3)
Chloride: 107 mmol/L (ref 98–111)
Creatinine, Ser: 0.6 mg/dL (ref 0.44–1.00)
GFR calc Af Amer: >60 mL/min (ref >60)
GFR calc non Af Amer: >60 mL/min (ref >60)
Glucose, Bld: 88 mg/dL (ref 70–99)
Potassium: 4 mmol/L (ref 3.5–5.1)
Sodium: 142 mmol/L (ref 135–145)

## 2020-04-20 LAB — CBC
HCT: 37.5 % (ref 36.0–46.0)
Hemoglobin: 12.3 g/dL (ref 12.0–15.0)
MCH: 28.7 pg (ref 26.0–34.0)
MCHC: 32.8 g/dL (ref 30.0–36.0)
MCV: 87.6 fL (ref 80.0–100.0)
Platelets: 274 10*3/uL (ref 150–400)
RBC: 4.28 MIL/uL (ref 3.87–5.11)
RDW: 12.5 % (ref 11.5–15.5)
WBC: 9.8 10*3/uL (ref 4.0–10.5)
nRBC: 0 % (ref 0.0–0.2)

## 2020-04-20 LAB — TRIGLYCERIDES: Triglycerides: 124 mg/dL (ref <150)

## 2020-04-20 MED ORDER — LACTATED RINGERS IV SOLN: INTRAVENOUS | Status: DC

## 2020-04-20 MED ORDER — FENTANYL CITRATE (PF) 100 MCG/2ML IJ SOLN: 50.0000 ug | INTRAMUSCULAR | Status: DC | PRN

## 2020-04-20 MED ORDER — SODIUM CHLORIDE 0.9 % IV SOLN
400.0000 mg | Freq: Three times a day (TID) | INTRAVENOUS | Status: DC | PRN
  Filled 2020-04-20: qty 4

## 2020-04-20 MED ORDER — DEXMEDETOMIDINE HCL IN NACL 400 MCG/100ML IV SOLN
0.0000 ug/kg/h | INTRAVENOUS | Status: DC
  Administered 2020-04-20: 0.4 ug/kg/h via INTRAVENOUS

## 2020-04-20 MED ORDER — IBUPROFEN 100 MG/5ML PO SUSP
400.0000 mg | Freq: Three times a day (TID) | ORAL | Status: DC | PRN
  Administered 2020-04-20 – 2020-04-21 (×2): 400 mg via ORAL
  Filled 2020-04-20 (×2): qty 20

## 2020-04-20 NOTE — Progress Notes (Signed)
NAME:  Mary Hines, MRN:  [id number], DOB:  02-27-1991, LOS: 1 ADMISSION DATE:  04/18/2020, CONSULTATION DATE: 04/19/2020 REFERRING MD: ED physician, CHIEF COMPLAINT: Status epilepticus   History of present illness   29 year old lady with history of seizure disorder who was being transferred from a jail to another and then started having seizure was brought into the emergency room at [facility] where she had another tonic-clonic seizure was given Ativan and was intubated for airway protection then she developed some diffuse rash requiring steroids and Benadryl. Neurology were contacted and patient was started on dipper Van fentanyl drip and was given Keppra and was advised to be transferred to [facility] for further management and EEG. Later in the night she developed another tonic-clonic seizure and was given 5 mg of IV Versed with improvement in her seizure. Patient was transferred to [facility] emergency room where we were called and I came to assess the patient. Patient was sedated with fentanyl and propofol and she is being connected to the EEG monitor. No obvious seizures. I was not able to get any history from the patient due to her current condition.  Past Medical History  Seizure disorder  Imaging: CT head: no acute intracranial finding.  EEG 5/24 > No sz seen. Slowing likely related to sedation.  EEG 5/25 > no Sz  INTERVAL HISTORY Periods of agitation overnight.   Objective   Blood pressure 105/64, pulse (!) 121, temperature 99.1 F (37.3 C), temperature source Axillary, resp. rate 18, height 5\' 4"  (1.626 m), weight 65 kg, SpO2 100 %.    Vent Mode: PSV;CPAP FiO2 (%):  [40 %] 40 % Set Rate:  [16 bmp] 16 bmp Vt Set:  [440 mL] 440 mL PEEP:  [5 cmH20] 5 cmH20 Pressure Support:  [5 cmH20] 5 cmH20 Plateau Pressure:  [14 cmH20-16 cmH20] 16 cmH20   Intake/Output Summary (Last 24 hours) at 04/20/2020 0834 Last data filed at 04/20/2020 0800 Gross per 24 hour  Intake 553.5  ml  Output 875 ml  Net -321.5 ml   Filed Weights   04/18/20 2245 04/20/20 0418  Weight: 65 kg 65 kg    Examination: General:  Young adult female on vent Neuro:  Drowsy but arouses to verbal and follows commands.  HEENT:  [place]/AT, No JVD noted, PERRL Cardiovascular:  RRR, no MRG Lungs:  Clear Abdomen:  Soft, non-distended Musculoskeletal:  No acute deformity Skin:  Intact, MMM  Assessment & Plan:   Status epilepticus:resolved - Continue keppra - cEEG off today  Acute hypoxemic respiratory failure: - Full vent support - SBT today hopeful for extubation.  - DC propofol and fentanyl infusions - Start precedex.  - PRN versed and fentanyl.  - VAP bundle   Substance abuse: UDS positive for amphetamines, THC, and benzo(likley from inpatient administration) - Supportive care - Rehabilitation assistance at the proper juncture.   Best practice: -Heparin subcu and SCDs for DVT prophylaxis -PPI for GI prophylaxis -NPO -Full code -No family info   Labs   CBC: Recent Labs  Lab 04/18/20 2234 04/19/20 0340 04/20/20 0710  WBC 7.7 6.0 9.8  NEUTROABS 4.6  --   --   HGB 14.4 12.8 12.3  HCT 42.9 38.5 37.5  MCV 87.6 87.7 87.6  PLT 360 298 274    Basic Metabolic Panel: Recent Labs  Lab 04/18/20 2234 04/18/20 2249 04/19/20 0340  NA 139  --   --   K 4.3  --   --   CL 105  --   --  CO2 23  --   --   GLUCOSE 101*  --   --   BUN 14  --   --   CREATININE 0.58  --  0.53  CALCIUM 8.9  --   --   MG  --  1.9  --    GFR: Estimated Creatinine Clearance: 90.4 mL/min (by C-G formula based on SCr of 0.53 mg/dL). Recent Labs  Lab 04/18/20 2234 04/19/20 0340 04/20/20 0710  WBC 7.7 6.0 9.8    Liver Function Tests: No results for input(s): AST, ALT, ALKPHOS, BILITOT, PROT, ALBUMIN in the last 168 hours. No results for input(s): LIPASE, AMYLASE in the last 168 hours. No results for input(s): AMMONIA in the last 168 hours.  ABG    Component Value Date/Time   PHART  7.439 04/18/2020 2328   PCO2ART 37.6 04/18/2020 2328   PO2ART 462 (H) 04/18/2020 2328   HCO3 24.9 04/18/2020 2328   O2SAT 99.6 04/18/2020 2328     Coagulation Profile: No results for input(s): INR, PROTIME in the last 168 hours.  Cardiac Enzymes: No results for input(s): CKTOTAL, CKMB, CKMBINDEX, TROPONINI in the last 168 hours.  HbA1C: No results found for: HGBA1C  CBG: No results for input(s): GLUCAP in the last 168 hours.  Review of Systems:   Could not be obtained due to sedation  Past Medical History  She,  has no past medical history on file.    Social History    Could not be obtained due to sedation  Family History   Her family history is not on file. Could not be obtained due to sedation  Allergies Allergies  Allergen Reactions  . Ivp Dye [Iodinated Diagnostic Agents] Hives  . Penicillins Hives  . Ativan [Lorazepam] Hives     Home Medications  Prior to Admission medications   Medication Sig Start Date End Date Taking? Authorizing Provider  aspirin EC 81 MG tablet Take 81 mg by mouth daily.   Yes [provider]  levETIRAcetam (KEPPRA) 1000 MG tablet Take 1,000 mg by mouth 2 (two) times daily.   Yes [provider]  traMADol (ULTRAM) 50 MG tablet Take 100 mg by mouth every 6 (six) hours as needed.   Yes [provider]     Critical care time 45 mins needed due to respiratory failure on vent, status epilepticus.    Georgann Housekeeper, AGACNP-BC [place]  See Amion for personal pager PCCM on call pager [phone]  04/20/2020 8:34 AM

## 2020-04-20 NOTE — Progress Notes (Signed)
EEG LTM complete. Has un blanchable redness for head wrap. No actual skin breakdown

## 2020-04-20 NOTE — Progress Notes (Signed)
eLink Physician-Brief Progress Note Patient Name: Mary Hines DOB: 01-Feb-1991 MRN: [id number]   Date of Service  04/20/2020  HPI/Events of Note  Musculoskeletal aches in the context of recent status epilepticus. Pt is NPO s/p recent extubation.  eICU Interventions  Ibuprofen iv PRN pain x 24 hours until po intake resumes.        Thomasene Lot Ogan 04/20/2020, 8:05 PM

## 2020-04-20 NOTE — Progress Notes (Signed)
   04/20/20 2014  Vitals  Pulse Rate (!) 162  ECG Heart Rate (!) 156  Resp (!) 29  Oxygen Therapy  SpO2 99 %  MEWS Score  MEWS Temp 0  MEWS Systolic 0  MEWS Pulse 3  MEWS RR 2  MEWS LOC 0  MEWS Score 5  MEWS Score Color Red  Provider Notification  Provider Name/Title Dr. Warrick Parisian, Dr. Amada Jupiter  Date Provider Notified 04/20/20  Time Provider Notified 2030  Notification Type Call  Notification Reason Change in status ("seizure" like activity)  Response No new orders  Date of Provider Response 04/20/20  Time of Provider Response 2035    Found patient on right side full body convulsing. RN pinched trapezius muscle and patient stopped shaking/trembling and looked at [employer]. The episode lasted about 30 seconds. CCM MD and Neuro MD made aware. No new orders at this time.

## 2020-04-20 NOTE — Progress Notes (Signed)
Reason for consult:   Subjective: Patient remains intubated but awake and following commands. Patient had 1 episode of shaking captured on long-term EEG which was nonepileptic.   ROS: Limited due to patient's mental status  Examination  Vital signs in last 24 hours: Temp:  [98.5 F (36.9 C)-99.9 F (37.7 C)] 99 F (37.2 C) (05/25 0827) Pulse Rate:  [66-130] 75 (05/25 1200) Resp:  [13-36] 13 (05/25 1200) BP: (86-118)/(59-78) 105/64 (05/25 1100) SpO2:  [99 %-100 %] 100 % (05/25 1200) FiO2 (%):  [40 %] 40 % (05/25 0827) Weight:  [65 kg] 65 kg (05/25 0418)  General: lying in bed CVS: pulse-normal rate and rhythm RS: breathing comfortably Extremities: normal   Neuro: MS: Somnolent but follows commands CN: pupils equal and reactive,  EOMI, Motor: Moves all 4 extremities Reflexes:plantars: flexor Coordination: normal Gait: not tested  Basic Metabolic Panel: Recent Labs  Lab 04/18/20 2234 04/18/20 2249 04/19/20 0340 04/20/20 0710  NA 139  --   --  142  K 4.3  --   --  4.0  CL 105  --   --  107  CO2 23  --   --  23  GLUCOSE 101*  --   --  88  BUN 14  --   --  21*  CREATININE 0.58  --  0.53 0.60  CALCIUM 8.9  --   --  8.8*  MG  --  1.9  --   --     CBC: Recent Labs  Lab 04/18/20 2234 04/19/20 0340 04/20/20 0710  WBC 7.7 6.0 9.8  NEUTROABS 4.6  --   --   HGB 14.4 12.8 12.3  HCT 42.9 38.5 37.5  MCV 87.6 87.7 87.6  PLT 360 298 274     Coagulation Studies: No results for input(s): LABPROT, INR in the last 72 hours.  Imaging Reviewed:   EEG:   This study is suggestive ofmoderate to severe diffuse encephalopathy, nonspecific etiology butmostlikely related to sedation.The excessive beta activity seen in the background is most likely due to the effect of benzodiazepine and is a benign EEG pattern. No epileptiform discharges were seen throughout the recording.  Event button was pressed on 04/19/2020 at 1351 as described above without concomitant EEG  change.  However, focal motor seizures may not be seen on scalp EEG.  Therefore clinical correlation is indicated.  Another event was recorded on 04/19/2020 at 1426 as described above again without any concomitant EEG change.  During the episode patient was noted to have intermittent generalized nonrhythmic twitching/shivering-like movement.  Semiology of the episode did not seem consistent with seizure because of the intermittent nature.  Clinical correlation is suggested.  ASSESSMENT AND PLAN  29 year old female with history of seizures on Keppra presented to [facility] emergency department after having witnessed seizures while in jail.  While in the ED patient had tonic-clonic seizure activity lasting greater than 7 minutes despite 4 mg of Ativan and patient was intubated for airway protection. Transferred to [facility], ICU-and placed on long-term EEG monitoring.  Patient has 1 shaking spells captured on EEG which are nonepileptic. However unclear if this was spell was characteristic of previous events and therefore we will go ahead and continue to keep her on Keppra for now.  Reviewed LTM EEG-no further seizures. Had one nonepileptic event  Status epilepticus: Resolved Seizure versus nonepileptic events  Recommendations  DC continuous EEG Wean sedation as much as possible and extubate Continue Keppra 1000 mg twice daily Seizure precautions  Karena Addison Mende [place] [facility] Pager Number [phone] For questions after 7pm please refer to AMION to reach the Neurologist on call

## 2020-04-20 NOTE — Progress Notes (Signed)
eLink Physician-Brief Progress Note Patient Name: Mary Hines DOB: July 29, 1991 MRN: [id number]   Date of Service  04/20/2020  HPI/Events of Note  Pt had a "shaking spell" which Neurology feels is a pseudo-siezure per bedside RN, Pt is taking po.  eICU Interventions  Ibuprofen changed from iv to po.        Mary Hines 04/20/2020, 9:07 PM

## 2020-04-20 NOTE — Procedures (Signed)
Extubation Procedure Note  Patient Details:   Name: Mary Hines DOB: November 21, 1991 MRN: [id number]   Airway Documentation:    Vent end date: 04/20/20 Vent end time: 1026   Evaluation  O2 sats: stable throughout Complications: No apparent complications Patient did tolerate procedure well. Bilateral Breath Sounds: Clear  Placed on 4l/min [place] Incentive spirometer at bedside   Yes  Newt Lukes 04/20/2020, 10:26 AM

## 2020-04-20 NOTE — Procedures (Addendum)
Patient Name: Mary Hines  MRN: [id number]  Epilepsy Attending: Charlsie Quest  Referring Physician/Provider: Dr Caryl Pina Duration: 04/19/2020 0700 to 04/20/2020 0700  Patient history: 29 year old female with known history of seizure presented with breakthrough seizures manifesting as status epilepticus.  EEG evaluate for seizures.  Level of alertness: Comatose  AEDs during EEG study: Keppra  Technical aspects: This EEG study was done with scalp electrodes positioned according to the 10-20 International system of electrode placement. Electrical activity was acquired at a sampling rate of 500Hz  and reviewed with a high frequency filter of 70Hz  and a low frequency filter of 1Hz . EEG data were recorded continuously and digitally stored.   Description: EEG initially showed continuous generalized 3 to 5 Hz theta-delta slowing with overriding 15 to 18 Hz beta activity with irregular morphology distributed symmetrically and diffusely.  Sleep was characterized by vertex waves, sleep spindles (12 to 14 Hz), maximal frontocentral region.  Event button was pressed on 04/19/2020 at 1351.  On video, patient was noted to initially have left leg jerking type movement which gradually increased in frequency and amplitude and appeared to involve all of left side.  After the event ended patient was noted to be gasping while intubated.   (difficult to visualize as patient was covered under sheets and had machines obstructing camera view).  Concomitant EEG before during and after the event did not show any EEG change to suggest seizure.  Another event was recorded on 04/19/2020 at 1426 during which on video patient was intermittently noted to have generalized nonrhythmic twitching/shivering-like movement.  Concomitant EEG before during and after the episode did not show any EEG change to suggest seizure.  ABNORMALITY -Excessive beta, generalized -Continuous slow, generalized  IMPRESSION: This  study is suggestive of moderate to severe diffuse encephalopathy, nonspecific etiology but most likely related to sedation. The excessive beta activity seen in the background is most likely due to the effect of benzodiazepine and is a benign EEG pattern. No epileptiform discharges were seen throughout the recording.  Event button was pressed on 04/19/2020 at 1351 as described above without concomitant EEG change.  However, focal motor seizures may not be seen on scalp EEG.  Therefore clinical correlation is indicated.  Another event was recorded on 04/19/2020 at 1426 as described above again without any concomitant EEG change.  During the episode patient was noted to have intermittent generalized nonrhythmic twitching/shivering-like movement.  Semiology of the episode did not seem consistent with seizure because of the intermittent nature.  Clinical correlation is suggested.   Javarus Dorner 04/21/2020

## 2020-04-21 DIAGNOSIS — F445 Conversion disorder with seizures or convulsions: Secondary | ICD-10-CM

## 2020-04-21 LAB — CBC
HCT: 37.6 % (ref 36.0–46.0)
Hemoglobin: 12.4 g/dL (ref 12.0–15.0)
MCH: 28.8 pg (ref 26.0–34.0)
MCHC: 33 g/dL (ref 30.0–36.0)
MCV: 87.2 fL (ref 80.0–100.0)
Platelets: 290 10*3/uL (ref 150–400)
RBC: 4.31 MIL/uL (ref 3.87–5.11)
RDW: 12.4 % (ref 11.5–15.5)
WBC: 5 10*3/uL (ref 4.0–10.5)
nRBC: 0 % (ref 0.0–0.2)

## 2020-04-21 LAB — MAGNESIUM: Magnesium: 1.6 mg/dL — ABNORMAL LOW (ref 1.7–2.4)

## 2020-04-21 LAB — BASIC METABOLIC PANEL
Anion gap: 9 (ref 5–15)
BUN: 12 mg/dL (ref 6–20)
CO2: 26 mmol/L (ref 22–32)
Calcium: 9.1 mg/dL (ref 8.9–10.3)
Chloride: 107 mmol/L (ref 98–111)
Creatinine, Ser: 0.56 mg/dL (ref 0.44–1.00)
GFR calc Af Amer: >60 mL/min (ref >60)
GFR calc non Af Amer: >60 mL/min (ref >60)
Glucose, Bld: 103 mg/dL — ABNORMAL HIGH (ref 70–99)
Potassium: 3.2 mmol/L — ABNORMAL LOW (ref 3.5–5.1)
Sodium: 142 mmol/L (ref 135–145)

## 2020-04-21 LAB — PHOSPHORUS: Phosphorus: 3.6 mg/dL (ref 2.5–4.6)

## 2020-04-21 LAB — TRIGLYCERIDES: Triglycerides: 139 mg/dL (ref <150)

## 2020-04-21 MED ORDER — POTASSIUM CHLORIDE CRYS ER 20 MEQ PO TBCR
40.0000 meq | EXTENDED_RELEASE_TABLET | Freq: Once | ORAL | Status: AC
  Administered 2020-04-21: 40 meq via ORAL
  Filled 2020-04-21: qty 2

## 2020-04-21 MED ORDER — POLYETHYLENE GLYCOL 3350 17 G PO PACK: 17.0000 g | PACK | Freq: Every day | ORAL | Status: DC | PRN

## 2020-04-21 MED ORDER — POLYETHYLENE GLYCOL 3350 17 G PO PACK
17.0000 g | PACK | Freq: Every day | ORAL | Status: DC
  Filled 2020-04-21: qty 1

## 2020-04-21 MED ORDER — DOCUSATE SODIUM 50 MG/5ML PO LIQD: 100.0000 mg | Freq: Two times a day (BID) | ORAL | Status: DC | PRN

## 2020-04-21 MED ORDER — LEVETIRACETAM 1000 MG PO TABS: 1000.0000 mg | ORAL_TABLET | Freq: Two times a day (BID) | ORAL | 3 refills | Status: DC

## 2020-04-21 MED ORDER — MAGNESIUM SULFATE 2 GM/50ML IV SOLN
2.0000 g | Freq: Once | INTRAVENOUS | Status: AC
  Administered 2020-04-21: 2 g via INTRAVENOUS
  Filled 2020-04-21: qty 50

## 2020-04-21 MED ORDER — DOCUSATE SODIUM 50 MG/5ML PO LIQD: 100.0000 mg | Freq: Two times a day (BID) | ORAL | Status: DC

## 2020-04-21 NOTE — Progress Notes (Signed)
Reason for consult:   Subjective: Patient extubated.  Alert oriented x3 and following commands.  States that her seizures have been mostly controlled since she has been on Keppra.  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  She has had at least 2 events that are captured on EEG-there are nonepileptic events   ROS: negative except above  Examination  Vital signs in last 24 hours: Temp:  [98.3 F (36.8 C)-99 F (37.2 C)] 98.3 F (36.8 C) (05/26 0800) Pulse Rate:  [66-162] 79 (05/26 1100) Resp:  [14-29] 15 (05/26 1202) BP: (89-121)/(50-86) 110/65 (05/26 1202) SpO2:  [93 %-100 %] 100 % (05/26 1100) Weight:  [64.9 kg] 64.9 kg (05/26 0500)  General: lying in be CVS: pulse-normal rate and rhythm RS: breathing comfortably Extremities: normal   Neuro: MS: Alert, oriented, follows commands CN: pupils equal and reactive,  EOMI, face symmetric, tongue midline, normal sensation over face, Motor: 5/5 strength in all 4 extremities Reflexes: 2+ bilaterally over patella, biceps, plantars: flexor Coordination: normal Gait: not tested  Basic Metabolic Panel: Recent Labs  Lab 04/18/20 2234 04/18/20 2249 04/19/20 0340 04/20/20 0710 04/21/20 0637  NA 139  --   --  142 142  K 4.3  --   --  4.0 3.2*  CL 105  --   --  107 107  CO2 23  --   --  23 26  GLUCOSE 101*  --   --  88 103*  BUN 14  --   --  21* 12  CREATININE 0.58  --  0.53 0.60 0.56  CALCIUM 8.9  --   --  8.8* 9.1  MG  --  1.9  --   --  1.6*  PHOS  --   --   --   --  3.6    CBC: Recent Labs  Lab 04/18/20 2234 04/19/20 0340 04/20/20 0710 04/21/20 0637  WBC 7.7 6.0 9.8 5.0  NEUTROABS 4.6  --   --   --   HGB 14.4 12.8 12.3 12.4  HCT 42.9 38.5 37.5 37.6  MCV 87.6 87.7 87.6 87.2  PLT 360 298 274 290     Coagulation Studies: No results for input(s): LABPROT, INR in the last 72 hours.  Imaging Reviewed:     ASSESSMENT AND PLAN  29 year old female apparently diagnosed to have  seizure disorder 3 years ago after head trauma currently on Keppra.  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  Has not seen a neurologist in several years due to lack of insurance.  Not compliant with Keppra due to affordability (tells me it costs her 1100 per month).  Unfortunately records are not available of prior diagnosis of seizures.   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  -Outpatient neurology follow-up in 2 to 4 weeks, consider tapering levetiracetam and replacing it with lamotrigine as this can help stabilize mood and treat nonepileptic events as well as function has an antiepileptic agent in case if she were to have seizures. -Seizure precautions -Cognitive behavior therapy   Georgiana Spinner Ahlana Slaydon [facility] Pager Number [phone] For questions after 7pm please refer to AMION to reach the Neurologist on call

## 2020-04-21 NOTE — Progress Notes (Signed)
   NAME:  Mary Hines, MRN:  [id number], DOB:  1990-12-13, LOS: 2 ADMISSION DATE:  04/18/2020, CONSULTATION DATE: 04/19/2020 REFERRING MD: ED physician, CHIEF COMPLAINT: Status epilepticus   History of present illness   29 year old lady with history of seizure disorder who was being transferred from a jail to another and then started having seizure was brought into the emergency room at [facility] where she had another tonic-clonic seizure was given Ativan and was intubated for airway protection then she developed some diffuse rash requiring steroids and Benadryl. Neurology were contacted and patient was started on diprivan and fentanyl drip and was given Keppra and was advised to be transferred to [facility] for further management and EEG. Later in the night she developed another tonic-clonic seizure and was given 5 mg of IV Versed with improvement in her seizure. Patient was transferred to [facility] emergency room where we were called and I came to assess the patient. Patient was sedated with fentanyl and propofol and she is being connected to the EEG monitor. No obvious seizures. I was not able to get any history from the patient due to her current condition.  Past Medical History  Seizure disorder  Imaging: CT head: no acute intracranial finding.  EEG 5/24 > No sz seen. Slowing likely related to sedation.  EEG 5/25 > no Sz  INTERVAL HISTORY Periods of agitation overnight.   Objective   Blood pressure 105/70, pulse 81, temperature 98.8 F (37.1 C), temperature source Axillary, resp. rate 16, height 5\' 4"  (1.626 m), weight 64.9 kg, SpO2 100 %.    Vent Mode: PSV;CPAP FiO2 (%):  [40 %] 40 % PEEP:  [5 cmH20] 5 cmH20 Pressure Support:  [5 cmH20] 5 cmH20   Intake/Output Summary (Last 24 hours) at 04/21/2020 0818 Last data filed at 04/21/2020 0700 Gross per 24 hour  Intake 1656.91 ml  Output --  Net 1656.91 ml   Filed Weights   04/18/20 2245 04/20/20 0418 04/21/20 0500   Weight: 65 kg 65 kg 64.9 kg    Examination: General: Young adult female, resting in bed, in NAD. Neuro: Awake, follows commands, moves all extremities. HEENT: [place]/AT. Sclerae anicteric. EOMI. Cardiovascular: RRR, no M/R/G.  Lungs: Respirations even and unlabored.  CTA bilaterally, No W/R/R. Abdomen: BS x 4, soft, NT/ND.  Musculoskeletal: No gross deformities, no edema.  Skin: Intact, warm, no rashes.   Assessment & Plan:   Status epilepticus: resolved. Unclear whether she had true seizures (no epileptiform activity captures on EEG).  Question pseudoseizures. - Continue keppra 1000mg  BID.  Acute hypoxemic respiratory failure - resolved following extubation 5/25. - No further interventions required.  Substance abuse: UDS positive for amphetamines, THC, and benzo (likley from inpatient administration). - Supportive care - Rehabilitation assistance at the proper juncture.   Hypokalemia. - 40 mEq K PO.  Hypomagnesemia.  - 2g Mag.   Stable.  Can likely discharge this afternoon.   , 6/25 [facility] 04/21/2020, 8:23 AM

## 2020-04-21 NOTE — Plan of Care (Signed)
Pt given all handouts and discharge paperwork. All questions answered.

## 2020-04-21 NOTE — Discharge Summary (Signed)
Physician Discharge Summary   Patient ID: Mary Hines MRN: [id number] DOB/AGE: [birth year] 29 y.o.  Admit date: 04/18/2020 Discharge date: 04/21/2020                     Discharge Plan by Diagnosis   Hx underlying seizure disorder with concern for pseudo-seizures this admission (EEG neg x 2). - Continue levetiracetam 1010m BID indefinitely. - She will need neurology follow up in the next 2 - 3 weeks. - Consider psychiatry evaluation.  Substance abuse - UDS positive for amphetamines, THC, benzos (did receive benzo's in ED). - Substance abuse counseling.   Discharge Summary  Mary Hines is a 29y.o. y/o female with a PMH of seizure disorder.  [facility]  [facility]  She was given ativan and was intubated.  Post intubation, she developed a rash and required steroids and benadryl.  [facility]  [facility] was negative.  UDS positive for amphetamines, THC, benzos (did receive benzo's in ED).  [facility]  There was concern for pseudoseizures given no epileptiform activity captured on EEG.  On 5/25, she was liberated from the ventilator.  On 5/26, she was deemed medically stable and [facility]  She is being discharged on Levetiracetam 10069mBID.  She is to have follow up with neurology in the next 2 - 3 weeks.  Neurology also mentioned that it would be reasonable to have her be seen by psychiatry to determine whether she could benefit from any therapy for pseudoseizures.         Significant Hospital Events   [facility]06[facility]07-24 [facility] 5/26 > discharged.  Significant Diagnostic Studies  CT head 5/06-07-24 neg. EEG 5/May 04, 2023 neg. EEG 5/25 >  neg.  Micro Data  COVID 04/01/06/24 neg.  Antimicrobials  None.  Consults  Neurology.  Objective:  Blood pressure 109/80, pulse 89, temperature 98.3 F (36.8 C), temperature source Oral, resp. rate (!) 22, height _0  (1.626 m), weight 64.9 kg, SpO2 99 %.        Intake/Output Summary (Last 24 hours) at 04/21/2020 1049 Last data filed at 04/21/2020 0700 Gross per 24 hour  Intake 1656.91 ml  Output --  Net 1656.91 ml   Filed Weights   04/18/20 2245 04/20/20 0418 04/21/20 0500  Weight: 65 kg 65 kg 64.9 kg    Physical Examination: General: Young adult female, resting in bed, in NAD. Neuro: A&O x 3, non-focal.  HEENT: [place]/AT. EOMI, sclerae anicteric. Cardiovascular: RRR, no M/R/G.  Lungs: Respirations even and unlabored.  CTA bilaterally, No W/R/R. Abdomen: BS x 4, soft, NT/ND.  Musculoskeletal: No gross deformities, no edema.  Skin: Intact, warm, no rashes.   Discharge Labs:  BMET Recent Labs  Lab 04/18/20 2234 04/18/20 2234 04/18/20 2249 [id number] 04/20/20 0710 04/21/20 0637  NA 139  --   --   --  142 142  K 4.3   < >  --   --  4.0 3.2*  CL 105  --   --   --  107 107  CO2 23  --   --   --  23 26  GLUCOSE 101*  --   --   --  88 103*  BUN 14  --   --   --  21* 12  CREATININE 0.58  --   --  0.53 0.60 0.56  CALCIUM 8.9  --   --   --  8.8* 9.1  MG  --   --  1.9  --   --  1.6*  PHOS  --   --   --   --   --  3.6   < > = values in this interval not displayed.    CBC Recent Labs  Lab 04/19/20 0340 04/20/20 0710 04/21/20 0637  HGB 12.8 12.3 12.4  HCT 38.5 37.5 37.6  WBC 6.0 9.8 5.0  PLT 298 274 290    Anti-Coagulation No results for input(s): INR in the last 168 hours.  Discharge Instructions    Diet - low sodium heart healthy   Complete by: As directed    Increase activity slowly   Complete by: As directed       Allergies as of 04/21/2020      Reactions   Ivp Dye [iodinated Diagnostic Agents] Hives, Rash   Pharyngitis   Penicillins Hives,  Rash   Pharyngitis   Ativan [lorazepam] Hives   Pt said she is not allergic to Ativan on 04/21/20   Shellfish Allergy Hives, Rash   Pharyngitis      Medication List    TAKE these medications   aspirin EC 81 MG tablet Take 81 mg by mouth daily.   levETIRAcetam 1000 MG tablet Commonly known as: KEPPRA Take 1 tablet (1,000 mg total) by mouth 2 (two) times daily.   traMADol 50 MG tablet Commonly known as: ULTRAM Take 100 mg by mouth every 6 (six) hours as needed.        Disposition: [facility]   Discharge Condition:  Mary Hines has met maximum benefit of inpatient care and is medically stable and cleared for discharge.  Patient is pending follow up as above.     Time spent on discharge: Greater than 30 minutes.   Montey Hora, [facility] 04/21/2020, 10:49 AM

## 2020-10-25 ENCOUNTER — Emergency Department (HOSPITAL_COMMUNITY)
Admission: EM | Admit: 2020-10-25 | Discharge: 2020-10-25 | Disposition: A | Discharge status: Home / Self Care | Source: Home / Self Care | Attending: Emergency Medicine | Admitting: Emergency Medicine

## 2020-10-25 DIAGNOSIS — F445 Conversion disorder with seizures or convulsions: Secondary | ICD-10-CM

## 2020-10-25 DIAGNOSIS — R569 Unspecified convulsions: Secondary | ICD-10-CM | POA: Insufficient documentation

## 2020-10-25 LAB — COMPREHENSIVE METABOLIC PANEL
ALT: 49 U/L — ABNORMAL HIGH (ref 0–44)
AST: 23 U/L (ref 15–41)
Albumin: 4.3 g/dL (ref 3.5–5.0)
Alkaline Phosphatase: 50 U/L (ref 38–126)
Anion gap: 11 (ref 5–15)
BUN: 12 mg/dL (ref 6–20)
CO2: 23 mmol/L (ref 22–32)
Calcium: 8.9 mg/dL (ref 8.9–10.3)
Chloride: 104 mmol/L (ref 98–111)
Creatinine, Ser: 0.53 mg/dL (ref 0.44–1.00)
GFR, Estimated: >60 mL/min (ref >60)
Glucose, Bld: 103 mg/dL — ABNORMAL HIGH (ref 70–99)
Potassium: 3.5 mmol/L (ref 3.5–5.1)
Sodium: 138 mmol/L (ref 135–145)
Total Bilirubin: 0.4 mg/dL (ref 0.3–1.2)
Total Protein: 7.7 g/dL (ref 6.5–8.1)

## 2020-10-25 LAB — CBC WITH DIFFERENTIAL/PLATELET
Abs Immature Granulocytes: 0.02 10*3/uL (ref 0.00–0.07)
Basophils Absolute: 0 10*3/uL (ref 0.0–0.1)
Basophils Relative: 0 %
Eosinophils Absolute: 0 10*3/uL (ref 0.0–0.5)
Eosinophils Relative: 0 %
HCT: 39.1 % (ref 36.0–46.0)
Hemoglobin: 13.4 g/dL (ref 12.0–15.0)
Immature Granulocytes: 0 %
Lymphocytes Relative: 14 %
Lymphs Abs: 1.1 10*3/uL (ref 0.7–4.0)
MCH: 29.6 pg (ref 26.0–34.0)
MCHC: 34.3 g/dL (ref 30.0–36.0)
MCV: 86.5 fL (ref 80.0–100.0)
Monocytes Absolute: 0.6 10*3/uL (ref 0.1–1.0)
Monocytes Relative: 7 %
Neutro Abs: 6.2 10*3/uL (ref 1.7–7.7)
Neutrophils Relative %: 79 %
Platelets: 309 10*3/uL (ref 150–400)
RBC: 4.52 MIL/uL (ref 3.87–5.11)
RDW: 13.2 % (ref 11.5–15.5)
WBC: 7.9 10*3/uL (ref 4.0–10.5)
nRBC: 0 % (ref 0.0–0.2)

## 2020-10-25 LAB — I-STAT BETA HCG BLOOD, ED (MC, WL, AP ONLY): I-stat hCG, quantitative: <5 m[IU]/mL (ref <5)

## 2020-10-25 LAB — ETHANOL: Alcohol, Ethyl (B): <10 mg/dL (ref <10)

## 2020-10-25 MED ORDER — AMMONIA AROMATIC IN INHA
RESPIRATORY_TRACT | Status: AC
  Filled 2020-10-25: qty 10

## 2020-10-25 MED ORDER — LEVETIRACETAM IN NACL 1000 MG/100ML IV SOLN
1000.0000 mg | Freq: Once | INTRAVENOUS | Status: AC
  Administered 2020-10-25: 1000 mg via INTRAVENOUS
  Filled 2020-10-25: qty 100

## 2020-10-25 MED ORDER — LEVETIRACETAM 1000 MG PO TABS: 1000.0000 mg | ORAL_TABLET | Freq: Two times a day (BID) | ORAL | 1 refills | Status: AC

## 2020-10-25 NOTE — Discharge Instructions (Addendum)
1.  Continue taking Keppra 1000 mg twice daily as prescribed. 2.  Make a follow-up appointment with a neurologist as soon as possible.  You have been given contact information from [facility]. 3. NO TRAMADOL.  Tramadol can increase the likelihood of having a seizure in people who are susceptible.

## 2020-10-25 NOTE — ED Provider Notes (Addendum)
[facility]-EMERGENCY DEPT Provider Note   CSN: [id number] Arrival date & time: 10/25/20  2011     History Chief Complaint  Patient presents with  . Seizures    Mary Hines is a 29 y.o. female.  HPI Patient has reported history of seizure disorder.  She reportedly is supposed to be taking Keppra.  She does report taking her Keppra dose this morning.  Patient was under arrest for failure to present at a court order.  Was arrested, patient began having seizure activity.  She had continuous movements of face and arms.  EMS called.  EMS advises due to the appearance of seizure-like activity, patient was given Versed 10 mg IV.  Throughout transport, respirations have been stable.  Patient has not required airway support.  Oxygen saturations have been stable.  Patient is a poor historian at this time.  She is whispering in a quiet and stuttering type voice.  Intermittent shaking activity that then stops with exam and questioning.    No past medical history on file.  Patient Active Problem List   Diagnosis Date Noted  . Acute hypoxemic respiratory failure (HCC)   . Status epilepticus (HCC) 04/19/2020  . Acute respiratory insufficiency        OB History   No obstetric history on file.     No family history on file.  Social History   Tobacco Use  . Smoking status: Not on file  Substance Use Topics  . Alcohol use: Not on file  . Drug use: Not on file    Home Medications Prior to Admission medications   Medication Sig Start Date End Date Taking? Authorizing Provider  gabapentin (NEURONTIN) 300 MG capsule Take 300 mg by mouth 2 (two) times daily.   Yes [provider]  levETIRAcetam (KEPPRA) 1000 MG tablet Take 1 tablet (1,000 mg total) by mouth 2 (two) times daily. 04/21/20  Yes Desai, Rahul P, PA-C  levETIRAcetam (KEPPRA) 1000 MG tablet Take 1 tablet (1,000 mg total) by mouth 2 (two) times daily. 10/25/20   Arby Barrette, MD     Allergies    Ivp dye [iodinated diagnostic agents], Penicillins, Ativan [lorazepam], and Shellfish allergy  Review of Systems   Review of Systems All 5 caveat cannot obtain full review of systems due to patient current condition. Physical Exam Updated Vital Signs BP 97/61   Pulse 76   Temp 98.1 F (36.7 C) (Oral)   Resp 13   Ht 5\' 2"  (1.575 m)   Wt 59 kg   LMP 10/21/2020 (Approximate)   SpO2 95%   BMI 23.78 kg/m   Physical Exam Constitutional:      Comments: On arrival on EMS, patient is doing some very low amplitude facial movements and arm movements.  No respiratory distress.  Once transferred to ED stretcher, motions have stopped.  HENT:     Head: Normocephalic and atraumatic.     Nose: Nose normal.     Mouth/Throat:     Mouth: Mucous membranes are moist.     Pharynx: Oropharynx is clear.     Comments: Patient protrudes her tongue on command.  No lacerations to the tongue Eyes:     Extraocular Movements: Extraocular movements intact.     Conjunctiva/sclera: Conjunctivae normal.     Pupils: Pupils are equal, round, and reactive to light.  Cardiovascular:     Comments: Borderline tachycardia.  No rub murmur gallop Pulmonary:     Effort: Pulmonary effort is normal.  Breath sounds: Normal breath sounds.  Abdominal:     General: There is no distension.     Palpations: Abdomen is soft.     Tenderness: There is no abdominal tenderness. There is no guarding.  Musculoskeletal:        General: No swelling or tenderness. Normal range of motion.     Cervical back: Neck supple.     Right lower leg: No edema.     Left lower leg: No edema.  Skin:    General: Skin is warm and dry.  Neurological:     Comments: Patient is vacillating between some repetitive motions of her face and arms and slowly answering questions.  She will follow commands to do grip strength and open her  mouth and stick out her tongue. While patient is having episode of seizure-like movement, she  maintains normal corneal pain response and blinks.     ED Results / Procedures / Treatments   Labs (all labs ordered are listed, but only abnormal results are displayed) Labs Reviewed  COMPREHENSIVE METABOLIC PANEL - Abnormal; Notable for the following components:      Result Value   Glucose, Bld 103 (*)    ALT 49 (*)    All other components within normal limits  ETHANOL  CBC WITH DIFFERENTIAL/PLATELET  URINALYSIS, ROUTINE W REFLEX MICROSCOPIC  RAPID URINE DRUG SCREEN, HOSP PERFORMED  I-STAT BETA HCG BLOOD, ED ([facility], [facility], [facility] ONLY)    EKG None  Radiology No results found.  Procedures Procedures (including critical care time)  Medications Ordered in ED Medications  ammonia inhalant (has no administration in time range)  levETIRAcetam (KEPPRA) IVPB 1000 mg/100 mL premix (0 mg Intravenous Stopped 10/25/20 2154)    ED Course  I have reviewed the triage vital signs and the nursing notes.  Pertinent labs & imaging results that were available during my care of the patient were reviewed by me and considered in my medical decision making (see chart for details).    MDM Rules/Calculators/A&P                         EMR reviewed from prior admission.  Patient was heavily sedated.  She had 2 EEGs.  EEGs did not exhibit epileptic waveforms.  She was seen by neurology.  At that time there was some report of pseudoseizure history as well.  Due to lack of ancillary history, it was decided to continue Keppra and have patient follow-up with neurology on outpatient basis.  11: 04 patient's vital signs are normal.  No tachycardia.  She is sleeping without any signs of seizure activity.  Once awakened, patient's eye motions are normal.  No appearance of ongoing seizure activity.  She is taciturn but well in appearance.  I have reviewed case with neuro hospitalist Dr. Iver Nestle.  Review of EMR shows the patient did not have epileptic waveforms during observed seizure-like movements during her last  hospitalization.  Very high suspicion for pseudoseizure.  At this time, vital signs are stable and lab work within normal limits.  [facility] patient to jail.  Will provide prescription to continue Keppra as prescribed at discharge from last hospitalization.  Recommendation is for follow-up with neurology as soon as possible.    Final Clinical Impression(s) / ED Diagnoses Final diagnoses:  Pseudoseizure    Rx / DC Orders ED Discharge Orders         Ordered    levETIRAcetam (KEPPRA) 1000 MG tablet  2 times daily        10/25/20 2302           Arby Barrette, MD 10/25/20 0947    Arby Barrette, MD 10/25/20 [phone]

## 2020-10-25 NOTE — ED Notes (Signed)
MD notified of continued seizure-like activity.  Pt appears to be following staff with her eyes during seizure activity.  Pt is also reactive to stimulation during these episodes.  Respirations remain even and unlabored.  O2 saturation 97-99% continuously.  MD advises to continue to monitor the patient.  Will continue to monitor.

## 2020-10-25 NOTE — ED Notes (Addendum)
Officer stated that Pt has had approximately 8-10 seizures prior to arriving at the ER. Patient has had 2 seizures at 2030 & 2035 while in the ER room 22. Pt stated her last seizure was at the beginning of of the month. Each seizure is approximately 15 seconds long.

## 2020-10-25 NOTE — ED Notes (Signed)
Pt resting quietly in bed.  NO signs of seizure activity

## 2020-10-25 NOTE — ED Triage Notes (Signed)
Pt to ER via EMS from jail after having a witnessed seizure.  Per EMS, pt's symptoms were not resolved with IV Versed.  PT seizing upon arrival, but symptoms resolved upon transfer to stretcher.  Officer at bedside.  Respirations even and unlabored.  NADN.

## 2020-10-26 ENCOUNTER — Emergency Department (HOSPITAL_COMMUNITY)
Admission: EM | Admit: 2020-10-26 | Discharge: 2020-10-26 | Disposition: A | Discharge status: Home / Self Care | Source: Home / Self Care | Attending: Emergency Medicine | Admitting: Emergency Medicine

## 2020-10-26 ENCOUNTER — Other Ambulatory Visit: Payer: Self-pay

## 2020-10-26 ENCOUNTER — Encounter (HOSPITAL_COMMUNITY): Payer: Self-pay

## 2020-10-26 ENCOUNTER — Emergency Department (HOSPITAL_COMMUNITY)

## 2020-10-26 DIAGNOSIS — R569 Unspecified convulsions: Secondary | ICD-10-CM | POA: Insufficient documentation

## 2020-10-26 LAB — CBC WITH DIFFERENTIAL/PLATELET
Abs Immature Granulocytes: 0.02 10*3/uL (ref 0.00–0.07)
Basophils Absolute: 0 10*3/uL (ref 0.0–0.1)
Basophils Relative: 0 %
Eosinophils Absolute: 0 10*3/uL (ref 0.0–0.5)
Eosinophils Relative: 0 %
HCT: 40.5 % (ref 36.0–46.0)
Hemoglobin: 13.6 g/dL (ref 12.0–15.0)
Immature Granulocytes: 0 %
Lymphocytes Relative: 17 %
Lymphs Abs: 1.1 10*3/uL (ref 0.7–4.0)
MCH: 29 pg (ref 26.0–34.0)
MCHC: 33.6 g/dL (ref 30.0–36.0)
MCV: 86.4 fL (ref 80.0–100.0)
Monocytes Absolute: 0.5 10*3/uL (ref 0.1–1.0)
Monocytes Relative: 8 %
Neutro Abs: 4.9 10*3/uL (ref 1.7–7.7)
Neutrophils Relative %: 75 %
Platelets: 308 10*3/uL (ref 150–400)
RBC: 4.69 MIL/uL (ref 3.87–5.11)
RDW: 13.1 % (ref 11.5–15.5)
WBC: 6.5 10*3/uL (ref 4.0–10.5)
nRBC: 0 % (ref 0.0–0.2)

## 2020-10-26 LAB — BASIC METABOLIC PANEL
Anion gap: 12 (ref 5–15)
BUN: 11 mg/dL (ref 6–20)
CO2: 22 mmol/L (ref 22–32)
Calcium: 9.2 mg/dL (ref 8.9–10.3)
Chloride: 105 mmol/L (ref 98–111)
Creatinine, Ser: 0.6 mg/dL (ref 0.44–1.00)
GFR, Estimated: >60 mL/min (ref >60)
Glucose, Bld: 103 mg/dL — ABNORMAL HIGH (ref 70–99)
Potassium: 3.7 mmol/L (ref 3.5–5.1)
Sodium: 139 mmol/L (ref 135–145)

## 2020-10-26 LAB — CBG MONITORING, ED: Glucose-Capillary: 95 mg/dL (ref 70–99)

## 2020-10-26 LAB — I-STAT BETA HCG BLOOD, ED (MC, WL, AP ONLY): I-stat hCG, quantitative: <5 m[IU]/mL (ref <5)

## 2020-10-26 MED ORDER — METOCLOPRAMIDE HCL 5 MG/ML IJ SOLN
5.0000 mg | Freq: Once | INTRAMUSCULAR | Status: AC
  Administered 2020-10-26: 5 mg via INTRAVENOUS
  Filled 2020-10-26: qty 2

## 2020-10-26 MED ORDER — KETOROLAC TROMETHAMINE 15 MG/ML IJ SOLN
15.0000 mg | Freq: Once | INTRAMUSCULAR | Status: AC
  Administered 2020-10-26: 15 mg via INTRAMUSCULAR
  Filled 2020-10-26: qty 1

## 2020-10-26 MED ORDER — DIPHENHYDRAMINE HCL 50 MG/ML IJ SOLN
12.5000 mg | Freq: Once | INTRAMUSCULAR | Status: AC
  Administered 2020-10-26: 12.5 mg via INTRAVENOUS
  Filled 2020-10-26: qty 1

## 2020-10-26 MED ORDER — LEVETIRACETAM 500 MG PO TABS
1000.0000 mg | ORAL_TABLET | Freq: Once | ORAL | Status: AC
  Administered 2020-10-26: 1000 mg via ORAL
  Filled 2020-10-26: qty 2

## 2020-10-26 NOTE — Discharge Instructions (Signed)
At this time there does not appear to be the presence of an emergent medical condition, however there is always the potential for conditions to change. Please read and follow the below instructions.  Please return to the Emergency Department immediately for any new or worsening symptoms. Please be sure to follow up with your Primary Care Provider within one week regarding your visit today; please call their office to schedule an appointment even if you are feeling better for a follow-up visit. Please take your antiepileptic Keppra as prescribed.  Please follow-up with your neurologist.  Go to the nearest Emergency Department immediately if: You have fever or chills You have a seizure that: Lasts longer than 5 minutes. Is different than seizures you had before. Makes it harder to breathe. Happens after you hurt your head. You have any of these symptoms after a seizure: Not being able to speak. Not being able to use a part of your body. Confusion. A bad headache. You have two or more seizures in a row. You do not wake up right after a seizure. You get hurt during a seizure. You have any new/concerning or worsening of symptoms  Please read the additional information packets attached to your discharge summary.  Do not take your medicine if  develop an itchy rash, swelling in your mouth or lips, or difficulty breathing; call 911 and seek immediate emergency medical attention if this occurs.  You may review your lab tests and imaging results in their entirety on your MyChart account.  Please discuss all results of fully with your primary care provider and other specialist at your follow-up visit.  Note: Portions of this text may have been transcribed using voice recognition software. Every effort was made to ensure accuracy; however, inadvertent computerized transcription errors may still be present.

## 2020-10-26 NOTE — ED Triage Notes (Signed)
Pt arrive EMS in [facility] custody from jail, reports multiple seizures today. Non- compliant with medications, EMS states possible psuedo seizure enroute, had eyelid and pain response to stimulation during transport. Seen here yesterday for same.  BP 122/78 HR 78 RR 20 Sp02 98 RA CBG 90  22 R hand

## 2020-10-26 NOTE — ED Provider Notes (Signed)
[facility]-EMERGENCY DEPT Provider Note   CSN: [id number] Arrival date & time: 10/26/20  1458     History Chief Complaint  Patient presents with  . Seizures    Mary Hines is a 29 y.o. female presents today from jail for seizure-like activity.  EMS was told patient had multiple episodes of seizure-like activity today with shaking of all extremities.  EMS has left prior to my initial evaluation.  History obtained from nursing note as well as patient.  On triage 9.  Patient had positive painful stimuli during her seizure like activity.  Patient is somnolent on exam slow speech without dysarthria, she reports that she had a seizure today in jail she does not remember the seizures.  She reports headache today she describes as aching of her entire head no radiating no clear aggravating or alleviating factors.  She reports that she had her Keppra yesterday in the ER when she was seen here for similar symptoms but prior to her ER visit yesterday she had not had her Keppra in several months.  She also has not had any seizures since the last time she was taken to prison in May 2021.  She denies any recent illness, is unclear if she had a fall, she denies any injury aside from headache, she denies neck pain chest pain abdominal pain back pain nausea vomiting recent illness or any additional concerns.  HPI     History reviewed. No pertinent past medical history.  Patient Active Problem List   Diagnosis Date Noted  . Acute hypoxemic respiratory failure (HCC)   . Status epilepticus (HCC) 04/19/2020  . Acute respiratory insufficiency     History reviewed. No pertinent surgical history.   OB History   No obstetric history on file.     No family history on file.  Social History   Tobacco Use  . Smoking status: Never Smoker  . Smokeless tobacco: Never Used  Substance Use Topics  . Alcohol use: Not on file  . Drug use: Not on file    Home  Medications Prior to Admission medications   Medication Sig Start Date End Date Taking? Authorizing Provider  gabapentin (NEURONTIN) 300 MG capsule Take 300 mg by mouth 2 (two) times daily.    [provider]  levETIRAcetam (KEPPRA) 1000 MG tablet Take 1 tablet (1,000 mg total) by mouth 2 (two) times daily. 04/21/20   Desai, Rahul P, PA-C  levETIRAcetam (KEPPRA) 1000 MG tablet Take 1 tablet (1,000 mg total) by mouth 2 (two) times daily. 10/25/20   Arby BarrettePfeiffer, Marcy, MD    Allergies    Ivp dye [iodinated diagnostic agents], Penicillins, Ativan [lorazepam], and Shellfish allergy  Review of Systems   Review of Systems Ten systems are reviewed and are negative for acute change except as noted in the HPI  Physical Exam Updated Vital Signs BP 115/80   Pulse 80   Temp 98.7 F (37.1 C) (Oral)   Resp 17   LMP 10/21/2020 (Approximate)   SpO2 99%   Physical Exam Constitutional:      General: She is not in acute distress.    Appearance: Normal appearance. She is well-developed. She is not ill-appearing or diaphoretic.  HENT:     Head: Normocephalic and atraumatic. No raccoon eyes or Battle's sign.     Jaw: There is normal jaw occlusion.     Right Ear: External ear normal. No hemotympanum.     Left Ear: External ear normal. No hemotympanum.  Ears:     Comments: Tympanostomy scars. Eyes:     General: Vision grossly intact. Gaze aligned appropriately.     Extraocular Movements: Extraocular movements intact.     Conjunctiva/sclera: Conjunctivae normal.     Pupils: Pupils are equal, round, and reactive to light.  Neck:     Trachea: Trachea and phonation normal. No tracheal tenderness or tracheal deviation.  Cardiovascular:     Rate and Rhythm: Normal rate and regular rhythm.     Pulses:          Dorsalis pedis pulses are 2+ on the right side and 2+ on the left side.  Pulmonary:     Effort: Pulmonary effort is normal. No respiratory distress.  Abdominal:     General: There is  no distension.     Palpations: Abdomen is soft.     Tenderness: There is no abdominal tenderness. There is no guarding or rebound.  Musculoskeletal:        General: Normal range of motion.     Cervical back: Normal range of motion and neck supple.     Comments: No midline C/T/L spinal tenderness to palpation, no paraspinal muscle tenderness, no deformity, crepitus, or step-off noted.  Feet:     Right foot:     Protective Sensation: 3 sites tested. 3 sites sensed.     Left foot:     Protective Sensation: 3 sites sensed.  Skin:    General: Skin is warm and dry.  Neurological:     Mental Status: She is alert.     GCS: GCS eye subscore is 4. GCS verbal subscore is 5. GCS motor subscore is 6.     Comments: Speech is clear and goal oriented, follows commands Major Cranial nerves without deficit, no facial droop Moves extremities without ataxia, coordination intact  Psychiatric:        Behavior: Behavior normal.     ED Results / Procedures / Treatments   Labs (all labs ordered are listed, but only abnormal results are displayed) Labs Reviewed  BASIC METABOLIC PANEL - Abnormal; Notable for the following components:      Result Value   Glucose, Bld 103 (*)    All other components within normal limits  CBC WITH DIFFERENTIAL/PLATELET  CBG MONITORING, ED  I-STAT BETA HCG BLOOD, ED ([facility], [facility], [facility] ONLY)    EKG None  Radiology CT Head Wo Contrast  Result Date: 10/26/2020 CLINICAL DATA:  Multiple seizures including possible pseudo seizure on route. Medication noncompliance EXAM: CT HEAD WITHOUT CONTRAST TECHNIQUE: Contiguous axial images were obtained from the base of the skull through the vertex without intravenous contrast. COMPARISON:  CT 04/19/2020 FINDINGS: Brain: No evidence of acute infarction, hemorrhage, hydrocephalus, extra-axial collection, visible mass lesion or mass effect. Vascular: No hyperdense vessel or unexpected calcification. Skull: No calvarial fracture or suspicious  osseous lesion. No scalp swelling or hematoma. Sinuses/Orbits: Appearance suggesting postsurgical changes from a right mastoidectomy. Correlate with surgical history. Some mild thickening of the right tympanic membrane is similar to prior as well. Paranasal sinuses and mastoid air cells as well as the middle ear cavities are otherwise clear. Included orbital structures are unremarkable. Other: None. IMPRESSION: 1. No acute intracranial findings. Electronically Signed   By: Kreg Shropshire M.D.   On: 10/26/2020 18:55    Procedures Procedures (including critical care time)  Medications Ordered in ED Medications  metoCLOPramide (REGLAN) injection 5 mg (5 mg Intravenous Given 10/26/20 1602)  diphenhydrAMINE (BENADRYL) injection 12.5 mg (12.5 mg Intravenous  Given 10/26/20 1602)  levETIRAcetam (KEPPRA) tablet 1,000 mg (1,000 mg Oral Given 10/26/20 1953)  ketorolac (TORADOL) 15 MG/ML injection 15 mg (15 mg Intramuscular Given 10/26/20 1955)    ED Course  I have reviewed the triage vital signs and the nursing notes.  Pertinent labs & imaging results that were available during my care of the patient were reviewed by me and considered in my medical decision making (see chart for details).    MDM Rules/Calculators/A&P                         Additional history obtained from: 1. Nursing notes from this visit. Review of electronic medical records.  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  Also reviewed patient's admission to the hospital in May 2021 for status epilepticus, she had arrived from jail for seizure, it appears she had been intubated at [facility].  She was transferred to [facility], had a negative CT head.  UDS showed amphetamines THC benzos.  EEG was negative.  Thought that this may be pseudoseizures.  She was extubated the following day and discharged on Keppra 1000 mg twice  daily. ----- 29 year old female presents today for possible seizure-like activity at jail again.  She just showed up at jail yesterday, on my exam she is tired appearing in no acute distress.  She is fully alert and oriented but speaks slowly, she endorses a headache denies any other pain or injury.  Normal neurologic exam with equal movements of bilateral extremities.  History of triage note would indicate possible pseudoseizures given response to painful stimuli during seizure activity.  Will obtain CBC, BMP, beta hCG, CBG and CT head given her headache.  Will give migraine cocktail and monitor.  Patient is handcuffed at the hands and feet to stretcher, there is no pain with palpation of the long bones or major joints and she moves all 4 extremities spontaneously and denies any pain.  No pain with palpation of the neck back or abdomen and no evidence of head injury. No indication for additional testing. - CBC within normal limits, no leukocytosis to suggest infection, no anemia. BMP shows no emergent electrolyte derangement, AKI or gap. Pregnancy test negative. CBG 95, no evidence of glycemic crisis. CT head:  IMPRESSION:  1. No acute intracranial findings.   I reevaluated the patient she reports temporary improvement of headache following Reglan and Benadryl but feels it is returning.  Discussed case with Dr. Madilyn Hook who is going to see patient. - Patient seen and evaluated by Dr. Madilyn Hook advises giving patient Toradol and home dose Keppra then discharge back to jail. - I reassessed patient she is resting comfortably in bed, no acute distress, detention officer at bedside.  No recurrent seizure-like activity in ER stay she remains fully alert and oriented, she is agreeable to discharge.  At this time there does not appear to be any evidence of an acute emergency medical condition and the patient appears stable for discharge with appropriate outpatient follow up. Diagnosis was discussed with patient  who verbalizes understanding of care plan and is agreeable to discharge. I have discussed return precautions with patient who verbalizes understanding. Patient encouraged to follow-up with their PCP and neurology. All questions answered.  Note: Portions of this report may have been transcribed using voice recognition software. Every effort was made to ensure accuracy; however, inadvertent computerized transcription errors  may still be present. Final Clinical Impression(s) / ED Diagnoses Final diagnoses:  Seizure-like activity [facility])    Rx / DC Orders ED Discharge Orders    None       Elizabeth Palau 10/26/20 2104    Tilden Fossa, MD 10/26/20 2238

## 2020-10-26 NOTE — ED Notes (Signed)
Patient has been discharged with [place] [facility]. No complaints at this time.

## 2020-10-27 ENCOUNTER — Encounter (HOSPITAL_COMMUNITY): Payer: Self-pay

## 2020-10-27 ENCOUNTER — Emergency Department (HOSPITAL_COMMUNITY)

## 2020-10-27 ENCOUNTER — Inpatient Hospital Stay (HOSPITAL_COMMUNITY)
Admission: EM | Admit: 2020-10-27 | Discharge: 2020-10-30 | DRG: 101 | Disposition: A | Discharge status: Home / Self Care | Attending: Internal Medicine | Admitting: Internal Medicine

## 2020-10-27 DIAGNOSIS — Z880 Allergy status to penicillin: Secondary | ICD-10-CM

## 2020-10-27 DIAGNOSIS — G40909 Epilepsy, unspecified, not intractable, without status epilepticus: Principal | ICD-10-CM | POA: Diagnosis present

## 2020-10-27 DIAGNOSIS — R61 Generalized hyperhidrosis: Secondary | ICD-10-CM | POA: Diagnosis present

## 2020-10-27 DIAGNOSIS — F32A Depression, unspecified: Secondary | ICD-10-CM | POA: Diagnosis present

## 2020-10-27 DIAGNOSIS — Z9114 Patient's other noncompliance with medication regimen: Secondary | ICD-10-CM

## 2020-10-27 DIAGNOSIS — Z91041 Radiographic dye allergy status: Secondary | ICD-10-CM

## 2020-10-27 DIAGNOSIS — L509 Urticaria, unspecified: Secondary | ICD-10-CM | POA: Diagnosis present

## 2020-10-27 DIAGNOSIS — E876 Hypokalemia: Secondary | ICD-10-CM | POA: Diagnosis present

## 2020-10-27 DIAGNOSIS — R569 Unspecified convulsions: Secondary | ICD-10-CM

## 2020-10-27 DIAGNOSIS — Z91013 Allergy to seafood: Secondary | ICD-10-CM

## 2020-10-27 DIAGNOSIS — E860 Dehydration: Secondary | ICD-10-CM

## 2020-10-27 DIAGNOSIS — Z888 Allergy status to other drugs, medicaments and biological substances status: Secondary | ICD-10-CM

## 2020-10-27 DIAGNOSIS — Z20822 Contact with and (suspected) exposure to covid-19: Secondary | ICD-10-CM | POA: Diagnosis present

## 2020-10-27 DIAGNOSIS — R8281 Pyuria: Secondary | ICD-10-CM | POA: Diagnosis present

## 2020-10-27 DIAGNOSIS — Z79899 Other long term (current) drug therapy: Secondary | ICD-10-CM

## 2020-10-27 DIAGNOSIS — Z823 Family history of stroke: Secondary | ICD-10-CM

## 2020-10-27 DIAGNOSIS — R200 Anesthesia of skin: Secondary | ICD-10-CM | POA: Diagnosis present

## 2020-10-27 DIAGNOSIS — Z8673 Personal history of transient ischemic attack (TIA), and cerebral infarction without residual deficits: Secondary | ICD-10-CM

## 2020-10-27 DIAGNOSIS — R112 Nausea with vomiting, unspecified: Secondary | ICD-10-CM

## 2020-10-27 DIAGNOSIS — F4310 Post-traumatic stress disorder, unspecified: Secondary | ICD-10-CM | POA: Diagnosis present

## 2020-10-27 HISTORY — DX: Cerebral infarction, unspecified: I63.9

## 2020-10-27 HISTORY — DX: Unspecified convulsions: R56.9

## 2020-10-27 LAB — CBC WITH DIFFERENTIAL/PLATELET
Abs Immature Granulocytes: 0.01 10*3/uL (ref 0.00–0.07)
Basophils Absolute: 0 10*3/uL (ref 0.0–0.1)
Basophils Relative: 0 %
Eosinophils Absolute: 0 10*3/uL (ref 0.0–0.5)
Eosinophils Relative: 0 %
HCT: 41.4 % (ref 36.0–46.0)
Hemoglobin: 13.8 g/dL (ref 12.0–15.0)
Immature Granulocytes: 0 %
Lymphocytes Relative: 18 %
Lymphs Abs: 1.3 10*3/uL (ref 0.7–4.0)
MCH: 29 pg (ref 26.0–34.0)
MCHC: 33.3 g/dL (ref 30.0–36.0)
MCV: 87 fL (ref 80.0–100.0)
Monocytes Absolute: 0.4 10*3/uL (ref 0.1–1.0)
Monocytes Relative: 6 %
Neutro Abs: 5.2 10*3/uL (ref 1.7–7.7)
Neutrophils Relative %: 76 %
Platelets: 285 10*3/uL (ref 150–400)
RBC: 4.76 MIL/uL (ref 3.87–5.11)
RDW: 12.9 % (ref 11.5–15.5)
WBC: 6.9 10*3/uL (ref 4.0–10.5)
nRBC: 0 % (ref 0.0–0.2)

## 2020-10-27 LAB — URINALYSIS, ROUTINE W REFLEX MICROSCOPIC
Bilirubin Urine: NEGATIVE
Glucose, UA: NEGATIVE mg/dL
Hgb urine dipstick: NEGATIVE
Ketones, ur: 80 mg/dL — AB
Nitrite: NEGATIVE
Protein, ur: 30 mg/dL — AB
Specific Gravity, Urine: 1.028 (ref 1.005–1.030)
pH: 6 (ref 5.0–8.0)

## 2020-10-27 LAB — BASIC METABOLIC PANEL
Anion gap: 12 (ref 5–15)
BUN: 20 mg/dL (ref 6–20)
CO2: 22 mmol/L (ref 22–32)
Calcium: 8.9 mg/dL (ref 8.9–10.3)
Chloride: 106 mmol/L (ref 98–111)
Creatinine, Ser: 0.56 mg/dL (ref 0.44–1.00)
GFR, Estimated: >60 mL/min (ref >60)
Glucose, Bld: 79 mg/dL (ref 70–99)
Potassium: 3.6 mmol/L (ref 3.5–5.1)
Sodium: 140 mmol/L (ref 135–145)

## 2020-10-27 LAB — RESP PANEL BY RT-PCR (FLU A&B, COVID) ARPGX2
Influenza A by PCR: NEGATIVE
Influenza B by PCR: NEGATIVE
SARS Coronavirus 2 by RT PCR: NEGATIVE

## 2020-10-27 MED ORDER — SODIUM CHLORIDE 0.9 % IV BOLUS
1000.0000 mL | Freq: Once | INTRAVENOUS | Status: AC
  Administered 2020-10-27: 1000 mL via INTRAVENOUS

## 2020-10-27 MED ORDER — MIDAZOLAM HCL 2 MG/2ML IJ SOLN
4.0000 mg | Freq: Once | INTRAMUSCULAR | Status: AC
  Administered 2020-10-27: 4 mg via INTRAVENOUS
  Filled 2020-10-27: qty 4

## 2020-10-27 MED ORDER — MIDAZOLAM HCL 2 MG/2ML IJ SOLN
2.0000 mg | Freq: Once | INTRAMUSCULAR | Status: AC
  Administered 2020-10-27: 2 mg via INTRAVENOUS
  Filled 2020-10-27: qty 2

## 2020-10-27 MED ORDER — MIDAZOLAM HCL 5 MG/5ML IJ SOLN: 2.0000 mg | Freq: Once | INTRAMUSCULAR | Status: DC

## 2020-10-27 MED ORDER — DIPHENHYDRAMINE HCL 50 MG/ML IJ SOLN
50.0000 mg | Freq: Once | INTRAMUSCULAR | Status: AC
  Administered 2020-10-27: 50 mg via INTRAVENOUS
  Filled 2020-10-27: qty 1

## 2020-10-27 MED ORDER — DIPHENHYDRAMINE HCL 25 MG PO CAPS
50.0000 mg | ORAL_CAPSULE | Freq: Once | ORAL | Status: DC
  Filled 2020-10-27: qty 2

## 2020-10-27 MED ORDER — ACETAMINOPHEN 325 MG PO TABS
650.0000 mg | ORAL_TABLET | Freq: Once | ORAL | Status: DC
Start: 2020-10-27 — End: 2020-10-28
  Filled 2020-10-27: qty 2

## 2020-10-27 MED ORDER — MIDAZOLAM HCL 2 MG/2ML IJ SOLN
INTRAMUSCULAR | Status: AC
  Administered 2020-10-27: 4 mg
  Filled 2020-10-27: qty 6

## 2020-10-27 NOTE — Progress Notes (Signed)
Failed attempt at MRI. Per radiology services/hospital policy MRI screening of unconscious/altered level of consciousness or unresponsive patients, the following criteria is needed: family/relative who is able to provide medical history or pre-MRI radiographic screening set (X-ray skull, neck, chest, abdomen, pelvis, and extremities). Clearly stated in policy, imaging must be done within one month of requested exam date.  With deepest regret the following criteria cannot be met at this time (no family in room, and no X-rays performed at this time). This policy has an effective date of January 1st, 2018. Unfortunately, the MRI department does close at 10pm.

## 2020-10-27 NOTE — ED Provider Notes (Signed)
[facility]-EMER[facility]PT Provider Note   CSN: [id number] Arrival date & time: 10/27/20  7544     History Chief Complaint  Patient presents with  . Seizures    Mary Hines is a 29 y.o. female.  Patient presents with concern for recurrent seizure-like activities.  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  She was arrested 2 days ago, and was seen in the ER for seizure-like disorder, work-up was benign and patient discharged home.  She is brought back by today, as she had another seizure-like activity lasting 3-4 minutes, terminated with 10mg  IM versed.  She states she has been off her Keppra for the past several months, but has been taking it again while in jail for the past 2 days at 1000mg  BID.  She states that typically, even if she takes Keppra, she has 2-3 breakthru seizures a week at baseline.        Past Medical History:  Diagnosis Date  . Pseudoseizures (HCC)   . Seizures (HCC)   . Stroke [facility])     Patient Active Problem List   Diagnosis Date Noted  . Dehydration 10/29/2020  . Intractable nausea and vomiting 10/29/2020  . Hypokalemia 10/29/2020  . Hypomagnesemia 10/29/2020  . Seizure-like activity (HCC) 10/28/2020  . Abnormal CT of the head 10/28/2020  . Seizures (HCC) 10/28/2020  . Acute hypoxemic respiratory failure (HCC)   . Status epilepticus (HCC) 04/19/2020  . Acute respiratory insufficiency     History reviewed. No pertinent surgical history.   OB History   No obstetric history on file.     Family History  Family history unknown: Yes    Social History   Tobacco Use  . Smoking status: Never Smoker  . Smokeless tobacco: Never Used  Substance Use Topics  . Alcohol use: Not on file  . Drug use: Not on file    Home Medications Prior to Admission medications   Medication Sig Start Date End Date Taking? Authorizing Provider  acetaminophen (TYLENOL) 325  MG tablet Take 650 mg by mouth every 6 (six) hours as needed for mild pain, fever or headache.   Yes [provider]  levETIRAcetam (KEPPRA) 1000 MG tablet Take 1 tablet (1,000 mg total) by mouth 2 (two) times daily. 10/25/20  Yes 04/21/2020, MD    Allergies    Ivp dye [iodinated diagnostic agents], Penicillins, Ativan [lorazepam], and Shellfish allergy  Review of Systems   Review of Systems  Constitutional: Negative for fever.  HENT: Negative for ear pain.   Eyes: Negative for pain.  Respiratory: Negative for cough.   Cardiovascular: Negative for chest pain.  Gastrointestinal: Negative for abdominal pain.  Genitourinary: Negative for flank pain.  Musculoskeletal: Negative for back pain.  Skin: Negative for rash.  Neurological: Positive for headaches.    Physical Exam Updated Vital Signs BP 117/75 (BP Location: Left Arm)   Pulse 66   Temp 99 F (37.2 C) (Oral)   Resp 18   Ht 5\' 2"  (1.575 m)   Wt 59 kg   LMP 10/21/2020 (Approximate)   SpO2 100%   BMI 23.79 kg/m   Physical Exam Constitutional:      General: She is not in acute distress.    Appearance: Normal appearance.     Comments: Awake, slightly drowsy appearing.  But follows commands, answers questions appropriately.  HENT:     Head: Normocephalic.     Nose: Nose normal.  Eyes:  Extraocular Movements: Extraocular movements intact.  Cardiovascular:     Rate and Rhythm: Normal rate.  Pulmonary:     Effort: Pulmonary effort is normal.  Abdominal:     Palpations: Abdomen is soft.     Tenderness: There is no abdominal tenderness.  Musculoskeletal:        General: Normal range of motion.     Cervical back: Normal range of motion.  Skin:    General: Skin is warm.  Neurological:     General: No focal deficit present.     Comments: Appears sleepy, but rousable, and answering questions with stuttering speech, which she states has been her baseline which patient attributes to prior CVA.       ED  Results / Procedures / Treatments   Labs (all labs ordered are listed, but only abnormal results are displayed) Labs Reviewed  URINE CULTURE - Abnormal; Notable for the following components:      Result Value   Culture MULTIPLE SPECIES PRESENT, SUGGEST RECOLLECTION (*)    All other components within normal limits  URINALYSIS, ROUTINE W REFLEX MICROSCOPIC - Abnormal; Notable for the following components:   APPearance HAZY (*)    Ketones, ur 80 (*)    Protein, ur 30 (*)    Leukocytes,Ua MODERATE (*)    Bacteria, UA RARE (*)    All other components within normal limits  BASIC METABOLIC PANEL - Abnormal; Notable for the following components:   Potassium 3.3 (*)    CO2 20 (*)    Calcium 8.8 (*)    All other components within normal limits  MAGNESIUM - Abnormal; Notable for the following components:   Magnesium 1.6 (*)    All other components within normal limits  BASIC METABOLIC PANEL - Abnormal; Notable for the following components:   BUN <5 (*)    Calcium 8.6 (*)    All other components within normal limits  CBG MONITORING, ED - Abnormal; Notable for the following components:   Glucose-Capillary 56 (*)    All other components within normal limits  CBG MONITORING, ED - Abnormal; Notable for the following components:   Glucose-Capillary 141 (*)    All other components within normal limits  RESP PANEL BY RT-PCR (FLU A&B, COVID) ARPGX2  BASIC METABOLIC PANEL  CBC WITH DIFFERENTIAL/PLATELET  CBC WITH DIFFERENTIAL/PLATELET  GLUCOSE, CAPILLARY  RAPID URINE DRUG SCREEN, HOSP PERFORMED  CBG MONITORING, ED    EKG None  Radiology Overnight EEG with video  Result Date: 10/29/2020 Charlsie Quest, MD     10/29/2020 11:02 AM Patient Name: Mary Hines MRN: [id number] Epilepsy Attending: Charlsie Quest Referring Physician/Provider: Dr. Milon Dikes Duration: 10/28/2020 0950 to 10/29/2020 1024  Patient history: 29 year old female with seizure-like episodes.  EEG to evaluate  for seizures.  Level of alertness: Awake, asleep  AEDs during EEG study: None  Technical aspects: This EEG study was done with scalp electrodes positioned according to the 10-20 International system of electrode placement. Electrical activity was acquired at a sampling rate of 500Hz  and reviewed with a high frequency filter of 70Hz  and a low frequency filter of 1Hz . EEG data were recorded continuously and digitally stored.  Description: The posterior dominant rhythm consists of 10 Hz activity of moderate voltage (25-35 uV) seen predominantly in posterior head regions, symmetric and reactive to eye opening and eye closing. Sleep was characterized by vertex waves, sleep spindles (12-14) hz, maximal frontocentral region. One event was recorded on 10/28/2020 at 2013, on 10/29/2020 at 0025.  Patient was laying in bed with eyes closes, non rhythmic facial twitching/grimacing. Concomitant eeg before, during and after the event didn't show any eeg change to suggest seizure. One event was recorded on 10/29/2020 at 031 and 0848. Patient was laying in bed and had repetitive eye blinking. Concomitant eeg before, during and after the event didn't show any eeg change to suggest seizure.  IMPRESSION: This study is within normal limits. No seizures or epileptiform discharges were seen throughout the recording. Four events were recorded as described above with facial twitching/grimacing and repetitive eye blinking without concomitant eeg change and were NON epileptic.  Charlsie QuestPriyanka O Yadav    Procedures Procedures (including critical care time)  Medications Ordered in ED Medications  acetaminophen (TYLENOL) tablet 650 mg (650 mg Oral Given 10/29/20 1757)    Or  acetaminophen (TYLENOL) suppository 650 mg ( Rectal See Alternative 10/29/20 1757)  enoxaparin (LOVENOX) injection 40 mg (40 mg Subcutaneous Given 10/30/20 1205)  promethazine (PHENERGAN) tablet 25 mg (25 mg Oral Given 10/30/20 1526)    Or  promethazine (PHENERGAN)  injection 25 mg ( Intravenous See Alternative 10/30/20 1526)    Or  promethazine (PHENERGAN) suppository 25 mg ( Rectal See Alternative 10/30/20 1526)  levETIRAcetam (KEPPRA) 100 MG/ML solution 1,000 mg (1,000 mg Oral Given 10/30/20 1205)  potassium chloride 10 mEq in 100 mL IVPB (0 mEq Intravenous Stopped 10/30/20 1140)  ibuprofen (ADVIL) tablet 400 mg (has no administration in time range)  pantoprazole (PROTONIX) EC tablet 40 mg (40 mg Oral Given 10/30/20 1205)  sodium chloride 0.9 % bolus 1,000 mL (0 mLs Intravenous Stopped 10/27/20 1240)  midazolam (VERSED) injection 2 mg (2 mg Intravenous Given 10/27/20 1154)  midazolam (VERSED) injection 4 mg (4 mg Intravenous Given 10/27/20 1237)  diphenhydrAMINE (BENADRYL) injection 50 mg (50 mg Intravenous Given 10/27/20 1301)  midazolam (VERSED) 2 MG/2ML injection (4 mg  Given 10/27/20 2158)  dextrose 50 % solution 25 mL (25 mLs Intravenous Given 10/28/20 0318)  gadobutrol (GADAVIST) 1 MMOL/ML injection 6 mL (6 mLs Intravenous Contrast Given 10/28/20 0826)  diphenhydrAMINE (BENADRYL) injection 12.5 mg (12.5 mg Intravenous Given 10/28/20 0858)  ketorolac (TORADOL) 15 MG/ML injection 15 mg (15 mg Intravenous Given 10/28/20 1041)  morphine 2 MG/ML injection 1 mg (1 mg Intravenous Given 10/28/20 1312)  sodium chloride 0.9 % bolus 500 mL (0 mLs Intravenous Stopped 10/29/20 1629)  ibuprofen (ADVIL) tablet 400 mg (400 mg Oral Given 10/29/20 0141)  LORazepam (ATIVAN) injection 2 mg (2 mg Intravenous Given 10/29/20 0034)  magnesium sulfate IVPB 2 g 50 mL (0 g Intravenous Stopped 10/29/20 1552)  morphine 2 MG/ML injection 1 mg (1 mg Intravenous Given 10/29/20 2037)    ED Course  I have reviewed the triage vital signs and the nursing notes.  Pertinent labs & imaging results that were available during my care of the patient were reviewed by me and considered in my medical decision making (see chart for details).    MDM Rules/Calculators/A&P                           Patient presents with multiple seizure-like episodes in the last 2 days.  She has been evaluated by neurology in May, and found to likely have pseudo-seizures.  CT showing new thalamic lesion.  Case discussed with Neuro Dr. Wilford CornerArora, recommending MRI to evaluated CT finding. Case to be signed out to on-coming physician.   Final Clinical Impression(s) / ED Diagnoses Final diagnoses:  Seizure-like activity (  [facility])    Rx / DC Orders ED Discharge Orders    None       Cheryll Cockayne, MD 10/30/20 1704

## 2020-10-27 NOTE — ED Notes (Signed)
Pt had a witnessed seizure by this RN. This RN observed pt's eyes rolling back in her head and twitching of the pt's head. Pt was unarousable to sternal rub. MD notified and @ bedside

## 2020-10-27 NOTE — ED Provider Notes (Addendum)
Assumed care of patient at 3 PM today.  Patient has now been seen 3 times in a row for 3 days for what was suspected to be pseudoseizures.  Patient has recently been placed in custody and came from jail.  Patient apparently had what appeared to be a injury to her head when she had a seizure and had a CT which showed new hypodensity in the right thalamus concerning for acute infarct.  Dr. Audley Hose spoke with Dr. Jerrell Belfast who felt that patient needed an MRI to know if this is was real.  Patient has now waited 9 hours for MRI.  MRI came to get the patient and reported because she was unable to consent they would not complete the MRI.  Patient did have more " seizure-like activity here".  She had eye twitching and blinking and staring to the left which resolved with speaking to her.  When lifting her hand over her face she had control of her extremities.  Patient was given Versed by nurse prior to my arrival but these appear to be more pseudoseizures.  Will discuss with neurology again as patient is unable to receive an MRI tonight to see if they want to do EEG monitoring.  11:08 PM Spoke with with neurology and at this time she feels the most appropriate course of action will be admitting the patient and having her transferred to [facility] so that they can do EEG monitoring and can have an MRI done there.  At this time we will not give any further medication for seizure-like activity unless there is change in exam.   Gwyneth Sprout, MD 10/27/20 2245    Gwyneth Sprout, MD 10/27/20 2309

## 2020-10-27 NOTE — ED Triage Notes (Signed)
Pt came from jail via EMS. C/C seizure. PMH of seizures and stroke. Normally takes keppra. Yesterday pt had 2 seizures, transported to hospital for seizures yesterday. Emesis event today. Injury to right side of head d/t seizure yesterday. 5 mg of versed and 350 NS given by EMS. A &O x4. Slightly hypotensive w/ EMS at 91/64, given NS. 20 g in left hand   103/56 79 bpm 96% on RA CBG 83 98.4 temp

## 2020-10-28 ENCOUNTER — Observation Stay (HOSPITAL_COMMUNITY)

## 2020-10-28 ENCOUNTER — Encounter (HOSPITAL_COMMUNITY): Payer: Self-pay | Admitting: Internal Medicine

## 2020-10-28 DIAGNOSIS — R112 Nausea with vomiting, unspecified: Secondary | ICD-10-CM | POA: Diagnosis not present

## 2020-10-28 DIAGNOSIS — Z79899 Other long term (current) drug therapy: Secondary | ICD-10-CM | POA: Diagnosis not present

## 2020-10-28 DIAGNOSIS — Z8673 Personal history of transient ischemic attack (TIA), and cerebral infarction without residual deficits: Secondary | ICD-10-CM | POA: Diagnosis not present

## 2020-10-28 DIAGNOSIS — R8281 Pyuria: Secondary | ICD-10-CM | POA: Diagnosis present

## 2020-10-28 DIAGNOSIS — R61 Generalized hyperhidrosis: Secondary | ICD-10-CM | POA: Diagnosis present

## 2020-10-28 DIAGNOSIS — Z880 Allergy status to penicillin: Secondary | ICD-10-CM | POA: Diagnosis not present

## 2020-10-28 DIAGNOSIS — Z91013 Allergy to seafood: Secondary | ICD-10-CM | POA: Diagnosis not present

## 2020-10-28 DIAGNOSIS — E860 Dehydration: Secondary | ICD-10-CM | POA: Diagnosis present

## 2020-10-28 DIAGNOSIS — R930 Abnormal findings on diagnostic imaging of skull and head, not elsewhere classified: Secondary | ICD-10-CM

## 2020-10-28 DIAGNOSIS — E876 Hypokalemia: Secondary | ICD-10-CM | POA: Diagnosis present

## 2020-10-28 DIAGNOSIS — F445 Conversion disorder with seizures or convulsions: Secondary | ICD-10-CM | POA: Diagnosis not present

## 2020-10-28 DIAGNOSIS — F4310 Post-traumatic stress disorder, unspecified: Secondary | ICD-10-CM | POA: Diagnosis present

## 2020-10-28 DIAGNOSIS — Z823 Family history of stroke: Secondary | ICD-10-CM | POA: Diagnosis not present

## 2020-10-28 DIAGNOSIS — R569 Unspecified convulsions: Secondary | ICD-10-CM | POA: Diagnosis present

## 2020-10-28 DIAGNOSIS — G40909 Epilepsy, unspecified, not intractable, without status epilepticus: Secondary | ICD-10-CM | POA: Diagnosis present

## 2020-10-28 DIAGNOSIS — Z20822 Contact with and (suspected) exposure to covid-19: Secondary | ICD-10-CM | POA: Diagnosis present

## 2020-10-28 DIAGNOSIS — L509 Urticaria, unspecified: Secondary | ICD-10-CM | POA: Diagnosis present

## 2020-10-28 DIAGNOSIS — Z91041 Radiographic dye allergy status: Secondary | ICD-10-CM | POA: Diagnosis not present

## 2020-10-28 DIAGNOSIS — Z888 Allergy status to other drugs, medicaments and biological substances status: Secondary | ICD-10-CM | POA: Diagnosis not present

## 2020-10-28 DIAGNOSIS — F32A Depression, unspecified: Secondary | ICD-10-CM | POA: Diagnosis present

## 2020-10-28 DIAGNOSIS — R200 Anesthesia of skin: Secondary | ICD-10-CM | POA: Diagnosis present

## 2020-10-28 DIAGNOSIS — Z9114 Patient's other noncompliance with medication regimen: Secondary | ICD-10-CM | POA: Diagnosis not present

## 2020-10-28 LAB — CBG MONITORING, ED
Glucose-Capillary: 56 mg/dL — ABNORMAL LOW (ref 70–99)
Glucose-Capillary: 141 mg/dL — ABNORMAL HIGH (ref 70–99)
Glucose-Capillary: 82 mg/dL (ref 70–99)

## 2020-10-28 MED ORDER — GADOBUTROL 1 MMOL/ML IV SOLN
6.0000 mL | Freq: Once | INTRAVENOUS | Status: AC | PRN
  Administered 2020-10-28: 6 mL via INTRAVENOUS

## 2020-10-28 MED ORDER — DEXTROSE 50 % IV SOLN
25.0000 mL | INTRAVENOUS | Status: AC
  Administered 2020-10-28: 25 mL via INTRAVENOUS
  Filled 2020-10-28: qty 50

## 2020-10-28 MED ORDER — DEXTROSE-NACL 5-0.45 % IV SOLN: INTRAVENOUS | Status: DC

## 2020-10-28 MED ORDER — ENOXAPARIN SODIUM 40 MG/0.4ML ~~LOC~~ SOLN
40.0000 mg | Freq: Every day | SUBCUTANEOUS | Status: DC
  Administered 2020-10-28 – 2020-10-30 (×3): 40 mg via SUBCUTANEOUS
  Filled 2020-10-28 (×3): qty 0.4

## 2020-10-28 MED ORDER — ONDANSETRON HCL 4 MG PO TABS
4.0000 mg | ORAL_TABLET | Freq: Four times a day (QID) | ORAL | Status: DC | PRN
  Administered 2020-10-29: 4 mg via ORAL
  Filled 2020-10-28: qty 1

## 2020-10-28 MED ORDER — MORPHINE SULFATE (PF) 2 MG/ML IV SOLN
1.0000 mg | Freq: Once | INTRAVENOUS | Status: AC
  Administered 2020-10-28: 1 mg via INTRAVENOUS
  Filled 2020-10-28: qty 1

## 2020-10-28 MED ORDER — DEXTROSE 50 % IV SOLN: 25.0000 mL | Freq: Once | INTRAVENOUS | Status: DC

## 2020-10-28 MED ORDER — LEVETIRACETAM IN NACL 1000 MG/100ML IV SOLN: 1000.0000 mg | Freq: Two times a day (BID) | INTRAVENOUS | Status: DC

## 2020-10-28 MED ORDER — LEVETIRACETAM 500 MG PO TABS: 1000.0000 mg | ORAL_TABLET | Freq: Two times a day (BID) | ORAL | Status: DC

## 2020-10-28 MED ORDER — ENOXAPARIN SODIUM 40 MG/0.4ML ~~LOC~~ SOLN: 40.0000 mg | Freq: Every day | SUBCUTANEOUS | Status: DC

## 2020-10-28 MED ORDER — ACETAMINOPHEN 650 MG RE SUPP: 650.0000 mg | Freq: Four times a day (QID) | RECTAL | Status: DC | PRN

## 2020-10-28 MED ORDER — DIPHENHYDRAMINE HCL 50 MG/ML IJ SOLN
12.5000 mg | Freq: Once | INTRAMUSCULAR | Status: AC
  Administered 2020-10-28: 12.5 mg via INTRAVENOUS
  Filled 2020-10-28: qty 1

## 2020-10-28 MED ORDER — ONDANSETRON HCL 4 MG/2ML IJ SOLN
4.0000 mg | Freq: Four times a day (QID) | INTRAMUSCULAR | Status: DC | PRN
  Administered 2020-10-28 (×2): 4 mg via INTRAVENOUS
  Filled 2020-10-28 (×2): qty 2

## 2020-10-28 MED ORDER — SODIUM CHLORIDE 0.9 % IV BOLUS
500.0000 mL | Freq: Once | INTRAVENOUS | Status: AC
  Administered 2020-10-29: 500 mL via INTRAVENOUS

## 2020-10-28 MED ORDER — ACETAMINOPHEN 325 MG PO TABS: 650.0000 mg | ORAL_TABLET | Freq: Four times a day (QID) | ORAL | Status: DC | PRN

## 2020-10-28 MED ORDER — KETOROLAC TROMETHAMINE 15 MG/ML IJ SOLN
15.0000 mg | Freq: Once | INTRAMUSCULAR | Status: AC
  Administered 2020-10-28: 15 mg via INTRAVENOUS
  Filled 2020-10-28: qty 1

## 2020-10-28 MED ORDER — DEXTROSE-NACL 5-0.9 % IV SOLN: INTRAVENOUS | Status: DC

## 2020-10-28 MED ORDER — ACETAMINOPHEN 325 MG PO TABS
650.0000 mg | ORAL_TABLET | Freq: Four times a day (QID) | ORAL | Status: DC | PRN
  Administered 2020-10-28 – 2020-10-29 (×3): 650 mg via ORAL
  Filled 2020-10-28 (×3): qty 2

## 2020-10-28 NOTE — Evaluation (Addendum)
Clinical/Bedside Swallow Evaluation Patient Details  Name: Mary Hines MRN: [id number] Date of Birth: [birth year]  Today's Date: 10/28/2020 Time: SLP Start Time (ACUTE ONLY): 1350 SLP Stop Time (ACUTE ONLY): 1400 SLP Time Calculation (min) (ACUTE ONLY): 10 min  Past Medical History:  Past Medical History:  Diagnosis Date  . Pseudoseizures (HCC)   . Seizures (HCC)   . Stroke [facility])    Past Surgical History: History reviewed. No pertinent surgical history. HPI:  Mary Hines is a 29 y.o. female with a past medical history significant for pseudoseizures, possible seizures, presenting to the ED with concern for seizures.   Assessment / Plan / Recommendation Clinical Impression  Pt demonstrates normal swallowing without signs of dysphagia or aspiration. He is able to self feed and though her movement is slow, there is is no observable weakness in her oral movement. Will start a regular diet and sign off.  SLP Visit Diagnosis: Dysphagia, unspecified (R13.10)    Aspiration Risk  Mild aspiration risk    Diet Recommendation Regular;Thin liquid   Liquid Administration via: Cup;Straw Medication Administration: Whole meds with liquid Supervision: Patient able to self feed    Other  Recommendations     Follow up Recommendations None      Frequency and Duration            Prognosis        Swallow Study   General HPI: Mary Hines is a 29 y.o. female with a past medical history significant for pseudoseizures, possible seizures, presenting to the ED with concern for seizures. Type of Study: Bedside Swallow Evaluation Previous Swallow Assessment: none Diet Prior to this Study: NPO Temperature Spikes Noted: No Respiratory Status: Room air History of Recent Intubation: No Behavior/Cognition: Alert;Cooperative Oral Cavity Assessment: Within Functional Limits Oral Care Completed by SLP: No Oral Cavity - Dentition: Adequate natural dentition Vision:  Functional for self-feeding Self-Feeding Abilities: Able to feed self Patient Positioning: Upright in bed Baseline Vocal Quality: Normal Volitional Cough: Strong Volitional Swallow: Able to elicit    Oral/Motor/Sensory Function Overall Oral Motor/Sensory Function: Within functional limits   Ice Chips     Thin Liquid Thin Liquid: Within functional limits Presentation: Straw;Self Fed    Nectar Thick Nectar Thick Liquid: Not tested   Honey Thick Honey Thick Liquid: Not tested   Puree Puree: Within functional limits Presentation: Spoon   Solid     Solid: Within functional limits Presentation: Self Fed     Mary Ditty, MA CCC-SLP  Acute Rehabilitation Services Pager [phone] Office [phone]  Mary Hines 10/28/2020,2:04 PM

## 2020-10-28 NOTE — Procedures (Signed)
Patient Name: Mary Hines  MRN: [id number]  Epilepsy Attending: Charlsie Quest  Referring Physician/Provider: Dr. Lyda Perone Date: 10/28/2020 Duration: 23.22 minutes  Patient history: 29 year old female with seizure-like episodes.  EEG to evaluate for seizures.  Level of alertness: Awake  AEDs during EEG study: None  Technical aspects: This EEG study was done with scalp electrodes positioned according to the 10-20 International system of electrode placement. Electrical activity was acquired at a sampling rate of 500Hz  and reviewed with a high frequency filter of 70Hz  and a low frequency filter of 1Hz . EEG data were recorded continuously and digitally stored.   Description: The posterior dominant rhythm consists of 10 Hz activity of moderate voltage (25-35 uV) seen predominantly in posterior head regions, symmetric and reactive to eye opening and eye closing. Physiologic photic driving was seen during photic stimulation.  Hyperventilation was not performed.     IMPRESSION: This study is within normal limits. No seizures or epileptiform discharges were seen throughout the recording.  Blanca Thornton 

## 2020-10-28 NOTE — Progress Notes (Signed)
Nurse reports that patient had seizure like activity  At 20:14 that lasted for 2 mins. AOx4 immediately after. Was answering questions during seizure like activity. Reported as tremors in arms. No medication given.

## 2020-10-28 NOTE — Care Plan (Signed)
Event reviewed, no eeg change during event. Likely non epileptic.   Dr Iver Nestle notified.  Please review final report for details.

## 2020-10-28 NOTE — Progress Notes (Signed)
PROGRESS NOTE    Mary Hines  IRJ:[id number] DOB: [birth year] DOA: 10/27/2020 PCP: Patient, No Pcp Per    Brief Narrative:  Mary Hines was admitted to the hospital with working diagnosis of seizure-like activity.  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   Patient was incarcerated on 10/25/2020, patient was incarcerated after missing court.  Same day she was brought to the hospital with seizure-like activity, she was diagnosed with pseudoseizures and discharged back to jail.  She was brought back to the ED on 11/30 for the same reason and then again 12/01.  Reported patient having eye twitching and blinking, along with seizure-like activity. On her initial physical examination her blood pressure was 104/69, heart rate 64, respirate 14, oxygen saturation 98%, she had moist mucous membranes, lungs clear to auscultation bilaterally, heart S1-S2, present rhythmic, soft abdomen, no extremity edema.  She was somnolent but able to arouse, and answering questions. Sodium 140, potassium 3.6, chloride 106, bicarb 22, glucose 79, BUN 20, creatinine 0.56, white count 6.9, hemoglobin 13.8, hematocrit 41.4, platelets 285.  SARS COVID-19 negative.  Urinalysis specific gravity 1.028, 30 protein, 6-10 white cells, 0-5 red cells. Head CT with new hypodensity in the right thalamus, possible acute infarct.  Patient was admitted for further work-up.  Follow-up MRI negative for acute CVA. Patient has been placed on continuous electroencephalography monitoring to confirm pseudoseizures.   Assessment & Plan:   Principal Problem:   Seizure-like activity (HCC) Active Problems:   Seizures (HCC)   1. Suspected seizure activity- psychogenic non convulsive seizures. Patient is more awake at the time of my examination, able to respond to questions appropriately but with a slow speech.  MRI negative for acute CVA.   On continuous  electroencephalography to rule out active seizures. Patient off anti-epileptic agents.  Continue neuro checks, fall and aspiration precautions.  Pending urine drug screen.  Follow with neurology recommendations.   2. Dehydration. Patient with poor oral intake due to neurologic alteration, continue gentle hydration with dextrose and isotonic saline.   3. Contrast reaction/ allergy. Post MRI patient had facial erythema that has improved with benadryl, continue close monitoring.    Patient continue to be at high risk for worsening altered mentation.   Status is: Inpatient  Remains inpatient appropriate because:Inpatient level of care appropriate due to severity of illness   Dispo: The patient is from: Home              Anticipated d/c is to: Home              Anticipated d/c date is: 2 days              Patient currently is not medically stable to d/c.   DVT prophylaxis: Enoxaparin   Code Status:   full  Family Communication:  No family at the bedside      Consultants:   neurology     Subjective: Patient is awake and alert, complains of persistent shaking, no nausea or vomiting, her speech is slow.   Objective: Vitals:   10/28/20 1330 10/28/20 1400 10/28/20 1415 10/28/20 1430  BP: 126/84 112/89 132/90 107/72  Pulse: 76 84 85 88  Resp: 17 (!) 21 18 17   Temp:      TempSrc:      SpO2: 98% 98% 97% 98%  Weight:      Height:        Intake/Output Summary (Last 24 hours) at 10/28/2020 1435  Last data filed at 10/28/2020 0037 Gross per 24 hour  Intake --  Output 400 ml  Net -400 ml   Filed Weights   10/27/20 0952  Weight: 59 kg    Examination:   General: Not in pain or dyspnea, deconditioned  Neurology: Awake and alert, non focal, her speech is slow but coherent.  E ENT: no pallor, no icterus, oral mucosa moist Cardiovascular: No JVD. S1-S2 present, rhythmic, no gallops, rubs, or murmurs. No lower extremity edema. Pulmonary: positive breath sounds bilaterally,  adequate air movement, no wheezing, rhonchi or rales. Gastrointestinal. Abdomen soft and non tender Skin. No rashes Musculoskeletal: no joint deformities     Data Reviewed: I have personally reviewed following labs and imaging studies  CBC: Recent Labs  Lab 10/25/20 2121 10/26/20 1551 10/27/20 1103  WBC 7.9 6.5 6.9  NEUTROABS 6.2 4.9 5.2  HGB 13.4 13.6 13.8  HCT 39.1 40.5 41.4  MCV 86.5 86.4 87.0  PLT 309 308 285   Basic Metabolic Panel: Recent Labs  Lab 10/25/20 2121 10/26/20 1551 10/27/20 1103  NA 138 139 140  K 3.5 3.7 3.6  CL 104 105 106  CO2 23 22 22   GLUCOSE 103* 103* 79  BUN 12 11 20   CREATININE 0.53 0.60 0.56  CALCIUM 8.9 9.2 8.9   GFR: Estimated Creatinine Clearance: 82.1 mL/min (by C-G formula based on SCr of 0.56 mg/dL). Liver Function Tests: Recent Labs  Lab 10/25/20 2121  AST 23  ALT 49*  ALKPHOS 50  BILITOT 0.4  PROT 7.7  ALBUMIN 4.3   No results for input(s): LIPASE, AMYLASE in the last 168 hours. No results for input(s): AMMONIA in the last 168 hours. Coagulation Profile: No results for input(s): INR, PROTIME in the last 168 hours. Cardiac Enzymes: No results for input(s): CKTOTAL, CKMB, CKMBINDEX, TROPONINI in the last 168 hours. BNP (last 3 results) No results for input(s): PROBNP in the last 8760 hours. HbA1C: No results for input(s): HGBA1C in the last 72 hours. CBG: Recent Labs  Lab 10/26/20 1611 10/28/20 0302 10/28/20 0350 10/28/20 1319  GLUCAP 95 56* 141* 82   Lipid Profile: No results for input(s): CHOL, HDL, LDLCALC, TRIG, CHOLHDL, LDLDIRECT in the last 72 hours. Thyroid Function Tests: No results for input(s): TSH, T4TOTAL, FREET4, T3FREE, THYROIDAB in the last 72 hours. Anemia Panel: No results for input(s): VITAMINB12, FOLATE, FERRITIN, TIBC, IRON, RETICCTPCT in the last 72 hours.    Radiology Studies: I have reviewed all of the imaging during this hospital visit personally     Scheduled Meds:   enoxaparin (LOVENOX) injection  40 mg Subcutaneous Daily   Continuous Infusions:  dextrose 5 % and 0.9% NaCl 75 mL/hr at 10/28/20 0320     LOS: 0 days        Tkai Large 14/02/21, MD

## 2020-10-28 NOTE — Progress Notes (Signed)
Patient received to room 3W04 via stretcher from ED accompanied by nurse and by [facility], as she had been incarcerated prior to admission.  Continuous EEG is intact.  Patient has an odd affect, and has inconsistent "twitching" of her eyes.  She is apparently having word finding difficulties with an extremely halting speech pattern that appears animated.  This pattern is, again, inconsistent.  No EEG changes noted.  Bed in low position with brakes locked.  Call light within reach.
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
## 2020-10-28 NOTE — H&P (Signed)
History and Physical    Mary Hines BSW:[id number] DOB: 04-Oct-1991 DOA: 10/27/2020  PCP: Patient, No Pcp Per  Patient coming from: [place]  I have personally briefly reviewed patient's old medical records in [facility]  Chief Complaint: Seizure like activity  HPI: Mary Hines is a 29 y.o. female with medical history significant of reported seizure disorder, doesn't take meds at baseline (supposed to be on Keppra but not taking this until recently).  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  Actually pt was intubated during this admit for what was believed to be status-epilepticus, until EEG proved that she was having non-epileptic events.  Patient hadnt been seen again until 10/25/20, when she was apparently arrested and put in [place] for missing court after which she was seen in the ED with seizure like activity that was judged to be pseudoseizures.  She was seen again yesterday with pseudoseizures.  Today she presents to the ED from [facility] with more reported seizure like activity.  Specifically patient has eye twitching and blinking that resolves with speaking to her.  When lifting hand over her face she has control of her extremities.   ED Course: Concerning however: CT scan today shows an apparently new hypodensity in Thalamus when compared to yesterday that is concerning for acute stroke.  Thus admitting patient for obs over to [facility] so we can obtain MRI brain, neuro eval, and EEG monitoring to determine if pt has actual acute stroke and r/o epileptic activity.   Review of Systems: As per HPI, otherwise all review of systems negative.  Past Medical History:  Diagnosis Date  . Pseudoseizures (HCC)   . Seizures (HCC)   . Stroke [facility])     History reviewed. No pertinent surgical history.   reports that she has never smoked. She has never used smokeless tobacco. No history on file for alcohol use and  drug use.  Allergies  Allergen Reactions  . Ivp Dye [Iodinated Diagnostic Agents] Hives and Rash    Pharyngitis  . Penicillins Hives and Rash    Pharyngitis  . Ativan [Lorazepam] Hives    Pt said she is not allergic to Ativan on 04/21/20  . Shellfish Allergy Hives and Rash    Pharyngitis    Family History  Family history unknown: Yes   Pt unable or unwilling to provide family history at this time.  Prior to Admission medications   Medication Sig Start Date End Date Taking? Authorizing Provider  acetaminophen (TYLENOL) 325 MG tablet Take 650 mg by mouth every 6 (six) hours as needed for mild pain, fever or headache.   Yes [provider]  levETIRAcetam (KEPPRA) 1000 MG tablet Take 1 tablet (1,000 mg total) by mouth 2 (two) times daily. 10/25/20  Yes Arby Barrette, MD    Physical Exam: Vitals:   10/27/20 2300 10/27/20 2330 10/28/20 0000 10/28/20 0030  BP: 104/69 100/60 102/62 105/65  Pulse: 64 64 69 84  Resp: 14 19 13 17   Temp:      TempSrc:      SpO2: 98% 98% 97% 99%  Weight:      Height:        Constitutional: NAD, calm, comfortable Eyes: PERRL, lids and conjunctivae normal ENMT: Mucous membranes are moist. Posterior pharynx clear of any exudate or lesions.Normal dentition.  Neck: normal, supple, no masses, no thyromegaly Respiratory: clear to auscultation bilaterally, no wheezing, no crackles. Normal respiratory effort. No accessory muscle use.  Cardiovascular: Regular rate and rhythm, no murmurs / rubs / gallops. No extremity edema. 2+ pedal pulses. No carotid bruits.  Abdomen: no tenderness, no masses palpated. No hepatosplenomegaly. Bowel sounds positive.  Musculoskeletal: no clubbing / cyanosis. No joint deformity upper and lower extremities. Good ROM, no contractures. Normal muscle tone.  Skin: no rashes, lesions, ulcers. No induration Neurologic: CN 2-12 grossly intact. Sensation intact, DTR normal. Strength 5/5 in all 4.  Psychiatric: Sleepy but  wakes up, answers questions with stuttering speech.   Labs on Admission: I have personally reviewed following labs and imaging studies  CBC: Recent Labs  Lab 10/25/20 2121 10/26/20 1551 10/27/20 1103  WBC 7.9 6.5 6.9  NEUTROABS 6.2 4.9 5.2  HGB 13.4 13.6 13.8  HCT 39.1 40.5 41.4  MCV 86.5 86.4 87.0  PLT 309 308 285   Basic Metabolic Panel: Recent Labs  Lab 10/25/20 2121 10/26/20 1551 10/27/20 1103  NA 138 139 140  K 3.5 3.7 3.6  CL 104 105 106  CO2 23 22 22   GLUCOSE 103* 103* 79  BUN 12 11 20   CREATININE 0.53 0.60 0.56  CALCIUM 8.9 9.2 8.9   GFR: Estimated Creatinine Clearance: 82.1 mL/min (by C-G formula based on SCr of 0.56 mg/dL). Liver Function Tests: Recent Labs  Lab 10/25/20 2121  AST 23  ALT 49*  ALKPHOS 50  BILITOT 0.4  PROT 7.7  ALBUMIN 4.3   No results for input(s): LIPASE, AMYLASE in the last 168 hours. No results for input(s): AMMONIA in the last 168 hours. Coagulation Profile: No results for input(s): INR, PROTIME in the last 168 hours. Cardiac Enzymes: No results for input(s): CKTOTAL, CKMB, CKMBINDEX, TROPONINI in the last 168 hours. BNP (last 3 results) No results for input(s): PROBNP in the last 8760 hours. HbA1C: No results for input(s): HGBA1C in the last 72 hours. CBG: Recent Labs  Lab 10/26/20 1611  GLUCAP 95   Lipid Profile: No results for input(s): CHOL, HDL, LDLCALC, TRIG, CHOLHDL, LDLDIRECT in the last 72 hours. Thyroid Function Tests: No results for input(s): TSH, T4TOTAL, FREET4, T3FREE, THYROIDAB in the last 72 hours. Anemia Panel: No results for input(s): VITAMINB12, FOLATE, FERRITIN, TIBC, IRON, RETICCTPCT in the last 72 hours. Urine analysis:    Component Value Date/Time   COLORURINE YELLOW 10/27/2020 2230   APPEARANCEUR HAZY (A) 10/27/2020 2230   LABSPEC 1.028 10/27/2020 2230   PHURINE 6.0 10/27/2020 2230   GLUCOSEU NEGATIVE 10/27/2020 2230   HGBUR NEGATIVE 10/27/2020 2230   BILIRUBINUR NEGATIVE 10/27/2020  2230   KETONESUR 80 (A) 10/27/2020 2230   PROTEINUR 30 (A) 10/27/2020 2230   NITRITE NEGATIVE 10/27/2020 2230   LEUKOCYTESUR MODERATE (A) 10/27/2020 2230    Radiological Exams on Admission: CT Head Wo Contrast  Result Date: 10/27/2020 CLINICAL DATA:  Seizure. EXAM: CT HEAD WITHOUT CONTRAST TECHNIQUE: Contiguous axial images were obtained from the base of the skull through the vertex without intravenous contrast. COMPARISON:  CT October 26, 2020. FINDINGS: Brain: New hypodensity in the right thalamus, concerning for acute infarct. No acute hemorrhage. No hydrocephalus. No mass lesion or abnormal mass effect. No midline shift. No extra-axial fluid collection Vascular: No hyperdense vessel identified. Skull: No acute fracture. Sinuses/Orbits: Visualized sinuses are clear. Other: No mastoid effusions. IMPRESSION: New hypodensity in the right thalamus, which is concerning for acute infarct. Recommend MRI to further evaluate. Electronically Signed   By: 14/11/2019 MD   On: 10/27/2020 12:52   CT Head Wo Contrast  Result Date: 10/26/2020 CLINICAL DATA:  Multiple seizures including possible pseudo seizure on route. Medication noncompliance EXAM: CT HEAD WITHOUT CONTRAST TECHNIQUE: Contiguous axial images were obtained from the base of the skull through the vertex without intravenous contrast. COMPARISON:  CT 04/19/2020 FINDINGS: Brain: No evidence of acute infarction, hemorrhage, hydrocephalus, extra-axial collection, visible mass lesion or mass effect. Vascular: No hyperdense vessel or unexpected calcification. Skull: No calvarial fracture or suspicious osseous lesion. No scalp swelling or hematoma. Sinuses/Orbits: Appearance suggesting postsurgical changes from a right mastoidectomy. Correlate with surgical history. Some mild thickening of the right tympanic membrane is similar to prior as well. Paranasal sinuses and mastoid air cells as well as the middle ear cavities are otherwise clear. Included  orbital structures are unremarkable. Other: None. IMPRESSION: 1. No acute intracranial findings. Electronically Signed   By: Kreg Shropshire M.D.   On: 10/26/2020 18:55    EKG: Independently reviewed.  Assessment/Plan Principal Problem:   Abnormal CT of the head Active Problems:   Seizure-like activity (HCC)    1. New thalamic lesion on CT scan - 1. MRI brain with and without contrast 2. Seizure-Like activity - 1. Neuro notified of pt transfer and to see and make recs 2. Right now Dr. Iver Nestle recommended no meds for the moment 3. Suspect pseudoseizures (see various EDP notes and exams not really consistent with seizures, as well as the big work up in May of this year including intubation for suspected status epilepticus, but ultimately determined to be nonepileptic events at that time). 3. Pyuria - 1. Mod LE in Urine, but no SIRS, no report of dysuria.  Will hold off on ABx for the moment.  UCx ordered  DVT prophylaxis: Lovenox Code Status: Full Family Communication: No family in room, pt in custody Disposition Plan: Back to [place] just as soon as stroke work up negative (hopefully) and neuro clears Consults called: Dr. Iver Nestle Admission status: Place in obs   Miyoshi Ligas M. DO [facility]  How to contact the [facility] Attending or Consulting provider 7A - 7P or covering provider during after hours 7P -7A, for this patient?  1. Check the care team in [facility] and look for a) attending/consulting [facility] provider listed and b) the [facility] team listed 2. Log into [email or web address]  Amion Physician Scheduling and messaging for groups and whole hospitals  On call and physician scheduling software for group practices, residents, hospitalists and other medical providers for call, clinic, rotation and shift schedules. On[employer] is a hospital-wide system for scheduling doctors and paging doctors on call. EasyPlot is for scientific plotting and data analysis.  [email or web address]  and use [place]'s  universal password to access. If you do not have the password, please contact the hospital operator.  3. Locate the [facility] provider you are looking for under [facility] and page to a number that you can be directly reached. 4. If you still have difficulty reaching the provider, please page the [facility] (Director on Call) for the Hospitalists listed on amion for assistance.  10/28/2020, 1:45 AM

## 2020-10-28 NOTE — ED Notes (Signed)
Patient arrived with EMS patient without any complaints at this time.

## 2020-10-28 NOTE — Progress Notes (Signed)
EEG completed, results pending. 

## 2020-10-28 NOTE — ED Notes (Signed)
Pt transported to MRI 

## 2020-10-28 NOTE — ED Notes (Addendum)
This RN did not get to meet pt prior to going to MRI. Offgoing RN informed this RN that pt's chest was blotchy after performing a swallow screen and coughing. Upon returning from MRI, pt's face chest, and upper arms are red/blotchy. Pt states it is burning. This RN had the prior RN who cared for the pt to come assess and see if any of the redness was new. RN confirmed the redness on face neck is all new. Will notify MD.

## 2020-10-28 NOTE — Progress Notes (Addendum)
Same day progress note Patient being hooked up to long-term EEG. Stuttering speech No motor deficits Appears awake and cooperative with exam. MRI normal.  The CT finding concerning for hypodensity likely spurious. Spot EEG normal.  Awaiting LTM EEG. We will follow Dr. Rollene Fare recommendations. -- Milon Dikes, MD [facility] Pager: [phone]

## 2020-10-28 NOTE — ED Notes (Signed)
Assuming care of patient at this time. Pt's blood glucose showed to be low. Medicated patient per MAR. Pt in NAD. Resp even and unlabored. Bed in low position.
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
## 2020-10-28 NOTE — Progress Notes (Signed)
Per telemetry pt w/ bigeminy PVCs. Dr. Herma Carson informed and EKG requested.

## 2020-10-28 NOTE — ED Provider Notes (Signed)
I assumed care of this patient.  Please see previous provider note for further details of Hx, PE.  Briefly patient is a 29 y.o. female who presented seizure-like activity found to have hypodense lesion in the right thalamus on CT scan transferred from [facility] for admission, MRI and EEG.  Hospitalist service already aware the patient.  Plan to admit for Obs for work-up.   We will follow up on imaging, EEG and neurology Recs.      Nira Conn, MD 10/28/20 [phone]

## 2020-10-28 NOTE — Progress Notes (Signed)
Pt noted by sherriff officer to be having a seizure at 2014, which lasted 2 mins. Pt eye twitching, rigid extremities, and tremors in hands. Pt AOx4 post seixure activity. Dr. Carren Rang informed.

## 2020-10-29 DIAGNOSIS — F445 Conversion disorder with seizures or convulsions: Secondary | ICD-10-CM

## 2020-10-29 DIAGNOSIS — R112 Nausea with vomiting, unspecified: Secondary | ICD-10-CM

## 2020-10-29 DIAGNOSIS — E860 Dehydration: Secondary | ICD-10-CM

## 2020-10-29 DIAGNOSIS — R569 Unspecified convulsions: Secondary | ICD-10-CM

## 2020-10-29 DIAGNOSIS — E876 Hypokalemia: Secondary | ICD-10-CM

## 2020-10-29 LAB — CBC WITH DIFFERENTIAL/PLATELET
Abs Immature Granulocytes: 0.01 10*3/uL (ref 0.00–0.07)
Basophils Absolute: 0 10*3/uL (ref 0.0–0.1)
Basophils Relative: 1 %
Eosinophils Absolute: 0.1 10*3/uL (ref 0.0–0.5)
Eosinophils Relative: 1 %
HCT: 37.2 % (ref 36.0–46.0)
Hemoglobin: 12.7 g/dL (ref 12.0–15.0)
Immature Granulocytes: 0 %
Lymphocytes Relative: 25 %
Lymphs Abs: 1.5 10*3/uL (ref 0.7–4.0)
MCH: 28.9 pg (ref 26.0–34.0)
MCHC: 34.1 g/dL (ref 30.0–36.0)
MCV: 84.7 fL (ref 80.0–100.0)
Monocytes Absolute: 0.6 10*3/uL (ref 0.1–1.0)
Monocytes Relative: 9 %
Neutro Abs: 3.8 10*3/uL (ref 1.7–7.7)
Neutrophils Relative %: 64 %
Platelets: 275 10*3/uL (ref 150–400)
RBC: 4.39 MIL/uL (ref 3.87–5.11)
RDW: 12.8 % (ref 11.5–15.5)
WBC: 6 10*3/uL (ref 4.0–10.5)
nRBC: 0 % (ref 0.0–0.2)

## 2020-10-29 LAB — BASIC METABOLIC PANEL
Anion gap: 11 (ref 5–15)
BUN: 6 mg/dL (ref 6–20)
CO2: 20 mmol/L — ABNORMAL LOW (ref 22–32)
Calcium: 8.8 mg/dL — ABNORMAL LOW (ref 8.9–10.3)
Chloride: 109 mmol/L (ref 98–111)
Creatinine, Ser: 0.55 mg/dL (ref 0.44–1.00)
GFR, Estimated: >60 mL/min (ref >60)
Glucose, Bld: 99 mg/dL (ref 70–99)
Potassium: 3.3 mmol/L — ABNORMAL LOW (ref 3.5–5.1)
Sodium: 140 mmol/L (ref 135–145)

## 2020-10-29 LAB — GLUCOSE, CAPILLARY: Glucose-Capillary: 97 mg/dL (ref 70–99)

## 2020-10-29 LAB — MAGNESIUM: Magnesium: 1.6 mg/dL — ABNORMAL LOW (ref 1.7–2.4)

## 2020-10-29 LAB — URINE CULTURE

## 2020-10-29 MED ORDER — LEVETIRACETAM 100 MG/ML PO SOLN
1000.0000 mg | Freq: Two times a day (BID) | ORAL | Status: DC
  Administered 2020-10-29 – 2020-10-30 (×2): 1000 mg via ORAL
  Filled 2020-10-29 (×3): qty 10

## 2020-10-29 MED ORDER — MAGNESIUM SULFATE 2 GM/50ML IV SOLN
2.0000 g | Freq: Once | INTRAVENOUS | Status: AC
  Administered 2020-10-29: 2 g via INTRAVENOUS
  Filled 2020-10-29: qty 50

## 2020-10-29 MED ORDER — LORAZEPAM 2 MG/ML IJ SOLN
2.0000 mg | Freq: Once | INTRAMUSCULAR | Status: AC
  Administered 2020-10-29: 2 mg via INTRAVENOUS
  Filled 2020-10-29: qty 1

## 2020-10-29 MED ORDER — PANTOPRAZOLE SODIUM 40 MG IV SOLR
40.0000 mg | INTRAVENOUS | Status: DC
  Administered 2020-10-29: 40 mg via INTRAVENOUS
  Filled 2020-10-29: qty 40

## 2020-10-29 MED ORDER — POTASSIUM CHLORIDE 10 MEQ/100ML IV SOLN
10.0000 meq | INTRAVENOUS | Status: DC
  Administered 2020-10-29: 10 meq via INTRAVENOUS
  Filled 2020-10-29: qty 100

## 2020-10-29 MED ORDER — LEVETIRACETAM 500 MG PO TABS
1000.0000 mg | ORAL_TABLET | Freq: Two times a day (BID) | ORAL | Status: DC
  Filled 2020-10-29: qty 2

## 2020-10-29 MED ORDER — KETOROLAC TROMETHAMINE 15 MG/ML IJ SOLN
15.0000 mg | Freq: Four times a day (QID) | INTRAMUSCULAR | Status: DC | PRN
  Administered 2020-10-29 – 2020-10-30 (×2): 15 mg via INTRAVENOUS
  Filled 2020-10-29 (×2): qty 1

## 2020-10-29 MED ORDER — PROMETHAZINE HCL 25 MG PO TABS
25.0000 mg | ORAL_TABLET | Freq: Four times a day (QID) | ORAL | Status: DC | PRN
  Administered 2020-10-30: 25 mg via ORAL
  Filled 2020-10-29 (×3): qty 1

## 2020-10-29 MED ORDER — PROMETHAZINE HCL 25 MG/ML IJ SOLN
25.0000 mg | Freq: Four times a day (QID) | INTRAMUSCULAR | Status: DC | PRN
  Administered 2020-10-29: 25 mg via INTRAVENOUS
  Filled 2020-10-29: qty 1

## 2020-10-29 MED ORDER — POTASSIUM CHLORIDE 10 MEQ/100ML IV SOLN
10.0000 meq | INTRAVENOUS | Status: AC
  Administered 2020-10-29 (×4): 10 meq via INTRAVENOUS
  Filled 2020-10-29 (×4): qty 100

## 2020-10-29 MED ORDER — PROMETHAZINE HCL 25 MG RE SUPP
25.0000 mg | Freq: Four times a day (QID) | RECTAL | Status: DC | PRN
  Filled 2020-10-29: qty 1

## 2020-10-29 MED ORDER — MORPHINE SULFATE (PF) 2 MG/ML IV SOLN
1.0000 mg | Freq: Once | INTRAVENOUS | Status: AC
  Administered 2020-10-29: 1 mg via INTRAVENOUS
  Filled 2020-10-29 (×2): qty 1

## 2020-10-29 MED ORDER — IBUPROFEN 200 MG PO TABS
400.0000 mg | ORAL_TABLET | Freq: Once | ORAL | Status: AC
  Administered 2020-10-29: 400 mg via ORAL
  Filled 2020-10-29: qty 2

## 2020-10-29 NOTE — Plan of Care (Signed)

## 2020-10-29 NOTE — Progress Notes (Signed)
PROGRESS NOTE    Mary Hines  XIP:[id number] DOB: May 02, 1991 DOA: 10/27/2020 PCP: Patient, No Pcp Per    Brief Narrative:  Mrs. Wiehe was admitted to the hospital with working diagnosis of seizure-like activity/ psychogenic non epileptic activity.  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   Patient was incarcerated on 10/25/2020, patient was incarcerated after missing court.  Same day she was brought to the hospital with seizure-like activity, she was diagnosed with pseudoseizures and discharged back to jail.  She was brought back to the ED on 11/30 for the same reason and then again 12/01.  Reported patient having eye twitching and blinking, along with seizure-like activity. On her initial physical examination her blood pressure was 104/69, heart rate 64, respirate 14, oxygen saturation 98%, she had moist mucous membranes, lungs clear to auscultation bilaterally, heart S1-S2, present rhythmic, soft abdomen, no extremity edema.  She was somnolent but able to arouse, and answering questions. Sodium 140, potassium 3.6, chloride 106, bicarb 22, glucose 79, BUN 20, creatinine 0.56, white count 6.9, hemoglobin 13.8, hematocrit 41.4, platelets 285.  SARS COVID-19 negative.  Urinalysis specific gravity 1.028, 30 protein, 6-10 white cells, 0-5 red cells. Head CT with new hypodensity in the right thalamus, possible acute infarct.  Patient was admitted for further work-up.  Follow-up MRI negative for acute CVA. Patient has been placed on continuous electroencephalography monitoring to confirm pseudoseizures.  Confirmed non psychogenic nonepileptic seizures, consulted psychiatry.   Assessment & Plan:   Principal Problem:   Seizure-like activity (HCC) Active Problems:   Seizures (HCC)   Dehydration   Intractable nausea and vomiting   Hypokalemia   Hypomagnesemia   1. Psychogenic non convulsive seizures.   Patient has ruled out for seizures, continue current regime with Keppra.   She is more awake and alert. Continue to have slow speech, likely psychiatric in origin. Case discussed with neurology and will consult psychiatry, possible depression and PTSD.   Poor family support, her mother is a nursing home since 2019 because CVA, and the rest of the family is in [place].  Out of bed to chair tid with meals, PT and OT evaluation.   Add ketorolac for headache, avoid narcotics.   2. Dehydration/ nausea and vomiting/ hypokalemia and hypomagnesemia. Patient with persistent nausea and vomiting, despite ondansetron.  Poor oral intake.  Renal function with serum cr at 0,55 with K at 3,3 and bicarbonate at 20, Na 140, Mg 1,6.  Continue IV fluids with dextrose and isotonic saline solutions, add phenergan and pantoprazole.  Add 60 meq kcl IV and 2 g Mag sulfate, follow renal function and electrolytes in am.   3. Contrast reaction/ allergy. Post MRI patient had facial erythema that has improved with benadryl, continue close monitoring.  No recurrent rash or edema,     Patient continue to be at high risk for worsening dehydration.   Status is: Inpatient  Remains inpatient appropriate because:IV treatments appropriate due to intensity of illness or inability to take PO   Dispo: The patient is from: Home              Anticipated d/c is to: Home              Anticipated d/c date is: 1 day              Patient currently is not medically stable to d/c.   DVT prophylaxis: Enoxaparin   Code Status:   full  Family Communication:  No family at the bedside      Consultants:   Neurology   Psychiatry   Procedures:   Continuous EEG monitoring.      Subjective: Patient complains of persistent nausea and vomiting, positive frontal headache, no chest pain or dyspnea. Had twitching episodes this am.   Objective: Vitals:   10/29/20 0029 10/29/20 0413 10/29/20 0438 10/29/20 0818  BP:  138/81 (!) 97/59 (!) 99/54 125/83  Pulse: 85 82  71  Resp: 18 16  18   Temp: 98.6 F (37 C) 98.3 F (36.8 C)  98.4 F (36.9 C)  TempSrc: Oral Oral  Oral  SpO2: 99% 100%  100%  Weight:      Height:        Intake/Output Summary (Last 24 hours) at 10/29/2020 1116 Last data filed at 10/29/2020 0429 Gross per 24 hour  Intake 500 ml  Output --  Net 500 ml   Filed Weights   10/27/20 0952  Weight: 59 kg    Examination:   General: Not in pain or dyspnea. Deconditioned and ill looking appearing.  Neurology: Awake and alert, her speech is slow.  E ENT: no pallor, no icterus, oral mucosa moist Cardiovascular: No JVD. S1-S2 present, rhythmic, no gallops, rubs, or murmurs. No lower extremity edema. Pulmonary: positive breath sounds bilaterally, adequate air movement, no wheezing, rhonchi or rales. Gastrointestinal. Abdomen soft and non tender Skin. No rashes Musculoskeletal: no joint deformities     Data Reviewed: I have personally reviewed following labs and imaging studies  CBC: Recent Labs  Lab 10/25/20 2121 10/26/20 1551 10/27/20 1103 10/29/20 0137  WBC 7.9 6.5 6.9 6.0  NEUTROABS 6.2 4.9 5.2 3.8  HGB 13.4 13.6 13.8 12.7  HCT 39.1 40.5 41.4 37.2  MCV 86.5 86.4 87.0 84.7  PLT 309 308 285 275   Basic Metabolic Panel: Recent Labs  Lab 10/25/20 2121 10/26/20 1551 10/27/20 1103 10/29/20 0137  NA 138 139 140 140  K 3.5 3.7 3.6 3.3*  CL 104 105 106 109  CO2 23 22 22  20*  GLUCOSE 103* 103* 79 99  BUN 12 11 20 6   CREATININE 0.53 0.60 0.56 0.55  CALCIUM 8.9 9.2 8.9 8.8*  MG  --   --   --  1.6*   GFR: Estimated Creatinine Clearance: 82.1 mL/min (by C-G formula based on SCr of 0.55 mg/dL). Liver Function Tests: Recent Labs  Lab 10/25/20 2121  AST 23  ALT 49*  ALKPHOS 50  BILITOT 0.4  PROT 7.7  ALBUMIN 4.3   No results for input(s): LIPASE, AMYLASE in the last 168 hours. No results for input(s): AMMONIA in the last 168 hours. Coagulation Profile: No  results for input(s): INR, PROTIME in the last 168 hours. Cardiac Enzymes: No results for input(s): CKTOTAL, CKMB, CKMBINDEX, TROPONINI in the last 168 hours. BNP (last 3 results) No results for input(s): PROBNP in the last 8760 hours. HbA1C: No results for input(s): HGBA1C in the last 72 hours. CBG: Recent Labs  Lab 10/26/20 1611 10/28/20 0302 10/28/20 0350 10/28/20 1319 10/29/20 0137  GLUCAP 95 56* 141* 82 97   Lipid Profile: No results for input(s): CHOL, HDL, LDLCALC, TRIG, CHOLHDL, LDLDIRECT in the last 72 hours. Thyroid Function Tests: No results for input(s): TSH, T4TOTAL, FREET4, T3FREE, THYROIDAB in the last 72 hours. Anemia Panel: No results for input(s): VITAMINB12, FOLATE, FERRITIN, TIBC, IRON, RETICCTPCT in the last 72 hours.    Radiology Studies: I have reviewed all of the imaging during  this hospital visit personally     Scheduled Meds: . enoxaparin (LOVENOX) injection  40 mg Subcutaneous Daily   Continuous Infusions: . dextrose 5 % and 0.9% NaCl 75 mL/hr at 10/28/20 2101     LOS: 1 day        Anyae Griffith Annett Gula, MD

## 2020-10-29 NOTE — Procedures (Addendum)
Patient Name: Mary Hines  MRN: [id number]  Epilepsy Attending: Charlsie Quest  Referring Physician/Provider: Dr. Milon Dikes Duration: 10/28/2020 0950 to 10/29/2020 1024  Patient history: 29 year old female with seizure-like episodes.  EEG to evaluate for seizures.  Level of alertness: Awake, asleep  AEDs during EEG study: None  Technical aspects: This EEG study was done with scalp electrodes positioned according to the 10-20 International system of electrode placement. Electrical activity was acquired at a sampling rate of 500Hz  and reviewed with a high frequency filter of 70Hz  and a low frequency filter of 1Hz . EEG data were recorded continuously and digitally stored.   Description: The posterior dominant rhythm consists of 10 Hz activity of moderate voltage (25-35 uV) seen predominantly in posterior head regions, symmetric and reactive to eye opening and eye closing. Sleep was characterized by vertex waves, sleep spindles (12-14) hz, maximal frontocentral region.  One event was recorded on 10/28/2020 at 2013, on 10/29/2020 at 0025. Patient was laying in bed with eyes closes, non rhythmic facial twitching/grimacing. Concomitant eeg before, during and after the event didn't show any eeg change to suggest seizure.  One event was recorded on 10/29/2020 at 031 and 0848. Patient was laying in bed and had repetitive eye blinking. Concomitant eeg before, during and after the event didn't show any eeg change to suggest seizure.  IMPRESSION: This study is within normal limits. No seizures or epileptiform discharges were seen throughout the recording.  Four events were recorded as described above with facial twitching/grimacing and repetitive eye blinking without concomitant eeg change and were NON epileptic.  Mary Hines 14/01/2020

## 2020-10-29 NOTE — Evaluation (Signed)
Clinical/Bedside Swallow Evaluation Patient Details  Name: Mary Hines MRN: [id number] Date of Birth: 07-14-1991  Today's Date: 10/29/2020 Time: SLP Start Time (ACUTE ONLY): 1519 SLP Stop Time (ACUTE ONLY): 1528 SLP Time Calculation (min) (ACUTE ONLY): 9 min  Past Medical History:  Past Medical History:  Diagnosis Date  . Pseudoseizures (HCC)   . Seizures (HCC)   . Stroke [facility])    Past Surgical History: History reviewed. No pertinent surgical history. HPI:  [name] is a 29 y.o. female with a past medical history significant for pseudoseizures, possible seizures, presenting to the ED with concern for seizures. BSE on 12/2 was WNL. EEG WNL. SLP was re-consulted on 12/3 due to pt "choking" on half of a keppra whic hwas given whole in thin liquids.    Assessment / Plan / Recommendation Clinical Impression  Pt was seen for repeat bedside swallow evaluation following "full on choking" episode with half of a Keppra per RN. Pt reported that the pill got "stucked" and that she "choked" for at least 30 minutes. Oral mechanism exam was unremarkable and dentition adequate. She refused all solids due to fear of "choking" but tolerated thin liquids at an intentionally slow rate (similar to that of her speech) without overt s/sx of aspiration. The possibility of pt having pills whole in puree was discussed with the pt, but she stated "I think crushed would be better." Pt's current diet of regular texture solids and thin liquids will be continued at this time per the recommendation from yesterday's evaluation since nurse denied the pt reporting any difficulty with meals. Considering pt's difficulty today, SLP will see pt once more to re-assess solids.  SLP Visit Diagnosis: Dysphagia, unspecified (R13.10)    Aspiration Risk  Mild aspiration risk    Diet Recommendation Regular;Thin liquid (continue current diet per prior assessment. )   Liquid Administration via:  Cup;Straw Medication Administration: Whole meds with puree (Pt requests that they be crushed) Supervision: Patient able to self feed    Other  Recommendations Oral Care Recommendations: Oral care BID   Follow up Recommendations None      Frequency and Duration min 1 x/week  1 week       Prognosis        Swallow Study   General Date of Onset: 10/29/20 HPI: [name] is a 29 y.o. female with a past medical history significant for pseudoseizures, possible seizures, presenting to the ED with concern for seizures. BSE on 12/2 was WNL. EEG WNL. SLP was re-consulted on 12/3 due to pt "choking" on half of a keppra whic hwas given whole in thin liquids.  Type of Study: Bedside Swallow Evaluation Previous Swallow Assessment: none Diet Prior to this Study: Regular;Thin liquids Temperature Spikes Noted: No Respiratory Status: Room air History of Recent Intubation: No Behavior/Cognition: Alert;Cooperative Oral Cavity Assessment: Within Functional Limits Oral Care Completed by SLP: No Oral Cavity - Dentition: Adequate natural dentition Vision: Functional for self-feeding Self-Feeding Abilities: Able to feed self Patient Positioning: Upright in bed Baseline Vocal Quality: Normal Volitional Cough: Strong Volitional Swallow: Able to elicit    Oral/Motor/Sensory Function Overall Oral Motor/Sensory Function: Within functional limits   Ice Chips Ice chips: Not tested (pt refused)   Thin Liquid Thin Liquid: Within functional limits Presentation: Straw;Self Fed    Nectar Thick Nectar Thick Liquid: Not tested   Honey Thick Honey Thick Liquid: Not tested   Puree Puree: Not tested (pt refused)   Solid     Solid: Not  tested (pt refused)     Careen Mauch I. Vear Clock, MS, CCC-SLP Acute Rehabilitation Services Office number [phone] Pager [phone]  Scheryl Marten 10/29/2020,3:55 PM

## 2020-10-29 NOTE — Consult Note (Signed)
[facility] Face-to-Face Psychiatry Consult   Reason for Consult: Depression and PTSD Referring Physician: Dr. Carollee Herter  Patient Identification: Mary Hines MRN:  [id number] Principal Diagnosis: Seizure-like activity (HCC) Diagnosis:  Principal Problem:   Seizure-like activity (HCC) Active Problems:   Seizures (HCC)   Total Time spent with patient: 45 minutes  Subjective:   Mary Hines is a 29 y.o. female patient admitted from a correctional facility with concerns for multiple seizures.  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  Chart review also recommend psych consult as her psychogenic seizures and patient could benefit from cognitive behavioral therapy and other treatment modalities.  Patient is seen and assessed by this nurse practitioner, she is observed to be lying in bed with an emesis bag in her hand even though she does not appear to be vomiting.  She is also noted to have a bright red erythematous urticarial rash prominent on the left side of her face and neck.  Her nurse reports headache(left side temporal) and vomiting per nurse tech.  Patient is awake and alert to self, place, and time.  She presents with a very blunted affect, slow and broken speech at times.  Her eyes appear to be close most of the evaluation although she does open them at times.  Her body mechanics appear to be delayed, as well as her speech.  Patient appears to have insight and appropriate judgment, regarding her medical condition.  She does admit to a history of depression, that she currently rates a 2 out of 10.  She denies any new stressors and or triggers at this time.  She does report this has happened before on multiple occasions most recently June 2021.  She states she has never taken any antidepressants, and a previous neurologist have recommend she be evaluated for BPD.  Upon further clarification  patient is asked if BPD stands for bipolar depression or borderline personality disorder in which she states she believes" is bipolar".  She denies any previous suicide attempts, suicidal intentions, and or suicidal thoughts at this time.  She denies any homicidal ideations and or auditory visual hallucinations.  She also denies any substance abuse.  She is currently in custody of [facility] department for failure to appear as per patient.  HPI:  Mary Hines is a 29 y.o. female with medical history significant of reported seizure disorder, doesn't take meds at baseline (supposed to be on Keppra but not taking this until recently). 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  Actually pt was intubated during this admit for what was believed to be status-epilepticus, until EEG proved that she was having non-epileptic events. Patient hadnt been seen again until 10/25/20, when she was apparently arrested and put in [place] for missing court after which she was seen in the ED with seizure like activity that was judged to be pseudoseizures. She was seen again yesterday with pseudoseizures. Today she presents to the ED from [facility] with more reported seizure like activity.  Specifically patient has eye twitching and blinking that resolves with speaking to her.  When lifting hand over her face she has control of her extremities.   Past Psychiatric History: Depression that is currently controlled at this time.  She currently rates her depression at 2 out of 10 with 10 being the worst.  She denies receiving any current or previous outpatient psychiatry services.  She denies any current or previous inpatient psychiatric admission.  She denies any current substance abuse.  She does admit to having charges for failure to appear, however will not elaborate on failure to appear charges.  She denies any previous history of suicide attempt,  self-mutilation, and or nonsuicidal self-injurious behavior.  Risk to Self:  Denies Risk to Others:  Denies Prior Inpatient Therapy:  Denies Prior Outpatient Therapy:  Denies  Past Medical History:  Past Medical History:  Diagnosis Date  . Pseudoseizures (HCC)   . Seizures (HCC)   . Stroke [facility](HCC)    History reviewed. No pertinent surgical history. Family History:  Family History  Family history unknown: Yes   Family Psychiatric  History: Denies Social History:  Social History   Substance and Sexual Activity  Alcohol Use None     Social History   Substance and Sexual Activity  Drug Use Not on file    Social History   Socioeconomic History  . Marital status: Unknown    Spouse name: Not on file  . Number of children: Not on file  . Years of education: Not on file  . Highest education level: Not on file  Occupational History  . Not on file  Tobacco Use  . Smoking status: Never Smoker  . Smokeless tobacco: Never Used  Substance and Sexual Activity  . Alcohol use: Not on file  . Drug use: Not on file  . Sexual activity: Not on file  Other Topics Concern  . Not on file  Social History Narrative  . Not on file   Social Determinants of Health   Financial Resource Strain:   . Difficulty of Paying Living Expenses: Not on file  Food Insecurity:   . Worried About Programme researcher, broadcasting/film/videounning Out of Food in the Last Year: Not on file  . Ran Out of Food in the Last Year: Not on file  Transportation Needs:   . Lack of Transportation (Medical): Not on file  . Lack of Transportation (Non-Medical): Not on file  Physical Activity:   . Days of Exercise per Week: Not on file  . Minutes of Exercise per Session: Not on file  Stress:   . Feeling of Stress : Not on file  Social Connections:   . Frequency of Communication with Friends and Family: Not on file  . Frequency of Social Gatherings with Friends and Family: Not on file  . Attends Religious Services: Not on file  . Active Member of  Clubs or Organizations: Not on file  . Attends BankerClub or Organization Meetings: Not on file  . Marital Status: Not on file   Additional Social History:    Allergies:   Allergies  Allergen Reactions  . Ivp Dye [Iodinated Diagnostic Agents] Hives and Rash    Pharyngitis  . Penicillins Hives and Rash    Pharyngitis  . Ativan [Lorazepam] Hives    Pt said she is not allergic to Ativan on 04/21/20  . Shellfish Allergy Hives and Rash    Pharyngitis    Labs:  Results for orders placed or performed during the hospital encounter of 10/27/20 (from the past 48 hour(s))  Resp Panel by RT-PCR (Flu A&B, Covid) Nasopharyngeal Swab     Status: None   Collection Time: 10/27/20  1:06 PM   Specimen: Nasopharyngeal Swab; Nasopharyngeal(NP) swabs in vial transport medium  Result Value Ref Range   SARS Coronavirus 2 by RT PCR NEGATIVE NEGATIVE    Comment: (NOTE) SARS-CoV-2 target nucleic acids are NOT DETECTED.  The SARS-CoV-2 RNA is generally detectable in upper respiratory specimens during the acute phase of infection. The lowest concentration of SARS-CoV-2 viral copies this assay can detect is 138 copies/mL. A negative result does not preclude SARS-Cov-2 infection and should not be used as the sole basis for treatment or other patient management decisions. A negative result may occur with  improper specimen collection/handling, submission of specimen other than nasopharyngeal swab, presence of viral mutation(s) within the areas targeted by this assay, and inadequate number of viral copies(<138 copies/mL). A negative result must be combined with clinical observations, patient history, and epidemiological information. The expected result is Negative.  Fact Sheet for Patients:  BloggerCourse.com  Fact Sheet for Healthcare Providers:  SeriousBroker.it  This test is no t yet approved or cleared by the [place] FDA and  has been authorized  for detection and/or diagnosis of SARS-CoV-2 by FDA under an Emergency Use Authorization (EUA). This EUA will remain  in effect (meaning this test can be used) for the duration of the COVID-19 declaration under Section 564(b)(1) of the Act, 21 U.S.C.section 360bbb-3(b)(1), unless the authorization is terminated  or revoked sooner.       Influenza A by PCR NEGATIVE NEGATIVE   Influenza B by PCR NEGATIVE NEGATIVE    Comment: (NOTE) The Xpert Xpress SARS-CoV-2/FLU/RSV plus assay is intended as an aid in the diagnosis of influenza from Nasopharyngeal swab specimens and should not be used as a sole basis for treatment. Nasal washings and aspirates are unacceptable for Xpert Xpress SARS-CoV-2/FLU/RSV testing.  Fact Sheet for Patients: BloggerCourse.com  Fact Sheet for Healthcare Providers: SeriousBroker.it  This test is not yet approved or cleared by the [place] FDA and has been authorized for detection and/or diagnosis of SARS-CoV-2 by FDA under an Emergency Use Authorization (EUA). This EUA will remain in effect (meaning this test can be used) for the duration of the COVID-19 declaration under Section 564(b)(1) of the Act, 21 U.S.C. section 360bbb-3(b)(1), unless the authorization is terminated or revoked.  Performed at [facility], [address], [place], [place] [address]   Urinalysis, Routine w reflex microscopic Urine, Clean Catch     Status: Abnormal   Collection Time: 10/27/20 10:30 PM  Result Value Ref Range   Color, Urine YELLOW YELLOW   APPearance HAZY (A) CLEAR   Specific Gravity, Urine 1.028 1.005 - 1.030   pH 6.0 5.0 - 8.0   Glucose, UA NEGATIVE NEGATIVE mg/dL   Hgb urine dipstick NEGATIVE NEGATIVE   Bilirubin Urine NEGATIVE NEGATIVE   Ketones, ur 80 (A) NEGATIVE mg/dL   Protein, ur 30 (A) NEGATIVE mg/dL   Nitrite NEGATIVE NEGATIVE   Leukocytes,Ua MODERATE (A) NEGATIVE   RBC / HPF 0-5  0 - 5 RBC/hpf   WBC, UA 6-10 0 - 5 WBC/hpf   Bacteria, UA RARE (A) NONE SEEN   Squamous Epithelial / LPF 6-10 0 - 5    Comment: Performed at [facility], [address], [place], [place] [address]  Culture, Urine     Status: Abnormal   Collection Time: 10/28/20  2:06 AM   Specimen: Urine, Random  Result Value Ref Range   Specimen Description URINE, RANDOM    Special Requests      NONE Performed at [facility], [address] [place], [place] [address]    Culture MULTIPLE SPECIES PRESENT, SUGGEST RECOLLECTION (A)    Report Status 10/29/2020 FINAL   CBG monitoring, ED     Status:  Abnormal   Collection Time: 10/28/20  3:02 AM  Result Value Ref Range   Glucose-Capillary 56 (L) 70 - 99 mg/dL    Comment: Glucose reference range applies only to samples taken after fasting for at least 8 hours.   Comment 1 Notify RN   CBG monitoring, ED     Status: Abnormal   Collection Time: 10/28/20  3:50 AM  Result Value Ref Range   Glucose-Capillary 141 (H) 70 - 99 mg/dL    Comment: Glucose reference range applies only to samples taken after fasting for at least 8 hours.   Comment 1 Notify RN   CBG monitoring, ED     Status: None   Collection Time: 10/28/20  1:19 PM  Result Value Ref Range   Glucose-Capillary 82 70 - 99 mg/dL    Comment: Glucose reference range applies only to samples taken after fasting for at least 8 hours.  Basic metabolic panel     Status: Abnormal   Collection Time: 10/29/20  1:37 AM  Result Value Ref Range   Sodium 140 135 - 145 mmol/L   Potassium 3.3 (L) 3.5 - 5.1 mmol/L   Chloride 109 98 - 111 mmol/L   CO2 20 (L) 22 - 32 mmol/L   Glucose, Bld 99 70 - 99 mg/dL    Comment: Glucose reference range applies only to samples taken after fasting for at least 8 hours.   BUN 6 6 - 20 mg/dL   Creatinine, Ser 9.67 0.44 - 1.00 mg/dL   Calcium 8.8 (L) 8.9 - 10.3 mg/dL   GFR, Estimated >89 >38 mL/min    Comment: (NOTE) Calculated using the CKD-EPI  Creatinine Equation (2021)    Anion gap 11 5 - 15    Comment: Performed at [facility], [address], [place], [place] [address]  CBC with Differential/Platelet     Status: None   Collection Time: 10/29/20  1:37 AM  Result Value Ref Range   WBC 6.0 4.0 - 10.5 K/uL   RBC 4.39 3.87 - 5.11 MIL/uL   Hemoglobin 12.7 12.0 - 15.0 g/dL   HCT 10.2 36 - 46 %   MCV 84.7 80.0 - 100.0 fL   MCH 28.9 26.0 - 34.0 pg   MCHC 34.1 30.0 - 36.0 g/dL   RDW 58.5 27.7 - 82.4 %   Platelets 275 150 - 400 K/uL   nRBC 0.0 0.0 - 0.2 %   Neutrophils Relative % 64 %   Neutro Abs 3.8 1.7 - 7.7 K/uL   Lymphocytes Relative 25 %   Lymphs Abs 1.5 0.7 - 4.0 K/uL   Monocytes Relative 9 %   Monocytes Absolute 0.6 0.1 - 1.0 K/uL   Eosinophils Relative 1 %   Eosinophils Absolute 0.1 0.0 - 0.5 K/uL   Basophils Relative 1 %   Basophils Absolute 0.0 0.0 - 0.1 K/uL   Immature Granulocytes 0 %   Abs Immature Granulocytes 0.01 0.00 - 0.07 K/uL    Comment: Performed at [facility], [address], [place], [place] [address]  Magnesium     Status: Abnormal   Collection Time: 10/29/20  1:37 AM  Result Value Ref Range   Magnesium 1.6 (L) 1.7 - 2.4 mg/dL    Comment: Performed at [facility], [address], [place], [place] [address]  Glucose, capillary     Status: None   Collection Time: 10/29/20  1:37 AM  Result Value Ref Range   Glucose-Capillary 97 70 -  99 mg/dL    Comment: Glucose reference range applies only to samples taken after fasting for at least 8 hours.    Current Facility-Administered Medications  Medication Dose Route Frequency Provider Last Rate Last Admin  . acetaminophen (TYLENOL) tablet 650 mg  650 mg Oral Q6H PRN Hillary Bow, DO   650 mg at 10/29/20 4098   Or  . acetaminophen (TYLENOL) suppository 650 mg  650 mg Rectal Q6H PRN Hillary Bow, DO      . dextrose 5 %-0.9 % sodium chloride infusion   Intravenous Continuous Hillary Bow, DO 75 mL/hr at 10/28/20 2101 New  Bag at 10/28/20 2101  . enoxaparin (LOVENOX) injection 40 mg  40 mg Subcutaneous Daily Lyda Perone M, DO   40 mg at 10/28/20 0901  . ketorolac (TORADOL) 15 MG/ML injection 15 mg  15 mg Intravenous Q6H PRN Arrien, York Ram, MD      . promethazine (PHENERGAN) tablet 25 mg  25 mg Oral Q6H PRN Arrien, York Ram, MD       Or  . promethazine (PHENERGAN) injection 25 mg  25 mg Intravenous Q6H PRN Arrien, York Ram, MD       Or  . promethazine (PHENERGAN) suppository 25 mg  25 mg Rectal Q6H PRN Arrien, York Ram, MD        Musculoskeletal: Strength & Muscle Tone: within normal limits Gait & Station: normal Patient leans: N/A  Psychiatric Specialty Exam: Physical Exam Vitals and nursing note reviewed.  Constitutional:      Appearance: Normal appearance. She is obese.  Skin:    Findings: Erythema (bright red urticarial rash present on face and neck) present.  Neurological:     Mental Status: She is alert.     Review of Systems  Psychiatric/Behavioral: Negative for agitation, behavioral problems, confusion, decreased concentration, dysphoric mood, hallucinations, self-injury, sleep disturbance and suicidal ideas. The patient is not nervous/anxious and is not hyperactive.   All other systems reviewed and are negative.   Blood pressure 125/83, pulse 71, temperature 98.4 F (36.9 C), temperature source Oral, resp. rate 18, height  (1.575 m), weight 59 kg, last menstrual period 10/21/2020, SpO2 100 %.Body mass index is 23.79 kg/m.  General Appearance: Fairly Groomed  Eye Contact:  Poor  Speech:  Slow and Slurred  Volume:  Decreased  Mood:  Anxious  Affect:  Flat  Thought Process:  Coherent, Linear and Descriptions of Associations: Intact  Orientation:  Full (Time, Place, and Person)  Thought Content:  Logical  Suicidal Thoughts:  No  Homicidal Thoughts:  No  Memory:  Immediate;   Fair Recent;   Fair  Judgement:  Fair  Insight:  Fair  Psychomotor  Activity:  Normal  Concentration:  Concentration: Fair and Attention Span: Fair  Recall:  [employer] of Knowledge:  Fair  Language:  Fair  Akathisia:  No  Handed:  Right  AIMS (if indicated):     Assets:  Communication Skills Desire for Improvement Financial Resources/Insurance Housing Leisure Time Physical Health Resilience Social Support Talents/Skills  ADL's:  Intact  Cognition:  WNL  Sleep:      Tanganyika Bowlds is a 29 year old female who medical history significant for reported seizure disorder, depression.  Patient presented with seizure-like activity after being arrested and sent to jail for failure to appear.  Patient also has similar presentation in May 2021 also associated with being arrested and placed in custody.  At that time it was determined that patient was  having nonepileptic events, likely psychogenic in nature.  Patient reports her depression is stable, she identifies her coping skills as staying away and isolating.  She denies any previous suicide attempts, current suicidal thoughts or intentions.  Treatment Plan Summary: Plan Patient does appear open to outpatient behavioral health services.  In her words she hopes not to be in custody long and would like to follow-up outpatient.  Did discuss this social support, outpatient resources, in addition to resources she can utilize while in custody..  Patient can discuss starting antidepressant medication once outpatient, does not show any imminent risk to self or others, therefore no need to start medication inpatient at this time.  Does appear to have medical decision-making capacity.  Patient does not meet inpatient criteria at this time. -Recommend working closely with social work to facilitate referral for outpatient behavioral health.    Disposition: No evidence of imminent risk to self or others at present.   Patient does not meet criteria for psychiatric inpatient admission. Supportive therapy provided about  ongoing stressors. Refer to IOP. Discussed crisis plan, support from social network, calling 911, coming to the Emergency Department, and calling Suicide Hotline.  Maryagnes Amos, FNP 10/29/2020 12:03 PM

## 2020-10-29 NOTE — Progress Notes (Signed)
Pt w/ another seizure type episode at 0026. Dr. Herma Carson informed.

## 2020-10-29 NOTE — Progress Notes (Signed)
tp's BP 99/54 and HR 56 asymptomatic. Dr. Herma Carson informed.

## 2020-10-29 NOTE — Progress Notes (Signed)
Pt with facial twitching at 0018 lasting , per Emu "actaul seizure". Dr. Herma Carson informed. Pt AOx4 afterwards.

## 2020-10-29 NOTE — Progress Notes (Signed)
vLTM EEG complete. No skin breakdown 
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
## 2020-10-30 LAB — BASIC METABOLIC PANEL
Anion gap: 6 (ref 5–15)
BUN: <5 mg/dL — ABNORMAL LOW (ref 6–20)
CO2: 24 mmol/L (ref 22–32)
Calcium: 8.6 mg/dL — ABNORMAL LOW (ref 8.9–10.3)
Chloride: 108 mmol/L (ref 98–111)
Creatinine, Ser: 0.47 mg/dL (ref 0.44–1.00)
GFR, Estimated: >60 mL/min (ref >60)
Glucose, Bld: 99 mg/dL (ref 70–99)
Potassium: 3.5 mmol/L (ref 3.5–5.1)
Sodium: 138 mmol/L (ref 135–145)

## 2020-10-30 MED ORDER — PANTOPRAZOLE SODIUM 40 MG PO TBEC
40.0000 mg | DELAYED_RELEASE_TABLET | Freq: Every day | ORAL | Status: DC
  Administered 2020-10-30: 40 mg via ORAL
  Filled 2020-10-30: qty 1

## 2020-10-30 MED ORDER — IBUPROFEN 200 MG PO TABS
400.0000 mg | ORAL_TABLET | Freq: Four times a day (QID) | ORAL | Status: DC | PRN
  Filled 2020-10-30: qty 2

## 2020-10-30 NOTE — Discharge Instructions (Signed)
Managing Non-Epileptic Seizures, Adult Epileptic seizures are caused by abnormal electrical activity in the brain, but not all seizures are caused by epilepsy. Seizures that are not caused by epilepsy are called non-epileptic seizures, and there are two types:  Physiologic non-epileptic seizure. This is also called provoked seizure or organic seizure. This type of seizure stops when the cause goes away or is treated. Possible causes include: ? High fever. ? High or low blood sugar (glucose). ? Brain injury. ? Brain infection.  Psychogenic non-epileptic seizure (PNES). This can be caused by a mental disturbance (psychological distress), not by abnormal brain activity or brain injury. This may look similar to other types of seizures. A PNES may be called an "event" or "attack" instead of a seizure. Possible causes include: ? Stress. ? Major life events, such as divorce or death of a loved one. ? Post-traumatic stress disorder (PTSD). ? Physical or sexual abuse. ? Mental health disorders, including anxiety and depression. General treatment recommendations Physiologic non-epileptic seizure  If you have physiologic non-epileptic seizures, your health care provider will treat the cause. These seizures are not likely to return, and they do not need further treatment. PNES  Your health care provider may suspect PNES if: ? You have seizures that keep coming back (recurring) and do not respond to seizure medicines. ? You do not show any abnormal brain activity during electrical brain activity testing (electroencephalogram, or EEG).  It is very important to understand that PNES is a real illness. It does not mean that the seizure is fake. It just means that the cause is different. PNES can be treated.  You may be referred to a mental health specialist (psychiatrist). This is because PNES is a mental health disorder. Mental health treatment may include: ? Talk therapy (cognitive behavioral therapy,  or CBT). This is the most effective treatment. Through CBT, you will learn to identify and manage the psychological distress that leads to seizures. ? Stress reduction and relaxation techniques. ? Family therapy. ? Medicines to treat depression or anxiety.  In many cases, knowing that seizures are not caused by epilepsy reduces or stops seizures. How to manage lifestyle changes Managing stress     Certain types of counseling can be very helpful for managing stress. A mental health professional can assess what other treatments may also help you, such as:  Cognitive behavioral therapy.  Medicine to treat depression or anxiety.  Biofeedback. This uses signals from your body (physiological responses) to help you learn to regulate anxiety.  Meditation.  Yoga. Consider self-care strategies to lower stress levels, such as:  Talking with a trusted friend or family member about your thoughts and feelings.  Muscle relaxation and breathing exercises.  Trying activities to relieve stress, such as: ? Deep breathing. ? Listening to music. ? Exercising or taking a walk. ? Writing in a journal. ? Creating artwork.  Relationships Make sure family members, friends, and co-workers are trained in how to help you if you have a seizure. If you have a seizure, people near you should:  Lay you on the ground to prevent a fall.  Place a pillow or piece of clothing under your head.  Loosen any tight clothing, especially around your neck.  Turn you onto your side. This helps keep your airway clear if you vomit. Make sure people do not try to hold you down, keep you still, or put anything in your mouth during a seizure. Follow these instructions at home: Medicines Medicines may be prescribed to   treat depression or anxiety that causes non-epileptic seizures. Avoid using alcohol and other substances that may prevent your medicines from working properly (may interact). It is also important  to:  Talk with your pharmacist or health care provider about all the medicines that you take, their possible side effects, and what medicines are safe to take together.  Make it your goal to take part in all treatment decisions (shared decision-making). Ask about possible side effects of medicines that your health care provider recommends, and tell him or her how you feel about having those side effects. It is best if shared decision-making with your health care provider is part of your total treatment plan.  Take over-the-counter and prescription medicines only as told by your health care provider. General instructions  Ask your health care provider if it is safe for you to drive.  Return to your normal activities as told by your health care provider. Ask your health care provider what activities are safe for you.  Keep all follow-up visits as told by your health care providers. This is important. Where to find support You can get support for managing non-epileptic seizures from support groups, either online or in-person. Your health care provider may be able to recommend a support group in your area. Where to find more information  Epilepsy Foundation: www.epilepsy.com  American Epilepsy Society: www.aesnet.org Contact a health care provider if:  Your seizures change or become more frequent.  You continue to have seizures after treatment. Get help right away if:  You injure yourself during a seizure.  You have: ? One seizure after another. ? Trouble recovering from a seizure. ? Chest pain or trouble breathing. ? A seizure that lasts longer than 5 minutes. These symptoms may represent a serious problem that is an emergency. Do not wait to see if the symptoms will go away. Get medical help right away. Call your local emergency services (911 in the [place] Do not drive yourself to the hospital. Summary  Seizures that are not caused by epilepsy are called non-epileptic seizures.  The two types of non-epileptic seizures are physiologic non-epileptic seizure and psychogenic non-epileptic seizure (PNES).  PNES is a treatable mental health disorder that is caused by psychological distress. It is very important to work with your mental health provider to find a treatment that works for you.  Learning to manage stress and anxiety is an important part of PNES treatment.  Make sure family members, friends, and co-workers are trained in how to help you if you have a seizure. If you have a seizure, they should lay you on the ground to prevent a fall, protect your head and neck, and turn you onto your side. This information is not intended to replace advice given to you by your health care provider. Make sure you discuss any questions you have with your health care provider. Document Revised: 11/25/2018 Document Reviewed: 07/10/2017 Elsevier Patient Education  2020 [employer].   Seizure, Adult A seizure is a sudden burst of abnormal electrical activity in the brain. Seizures usually last from 30 seconds to 2 minutes. They can cause many different symptoms. Usually, seizures are not harmful unless they last a long time. What are the causes? Common causes of this condition include:  Fever or infection.  Conditions that affect the brain, such as: ? A brain abnormality that you were born with. ? A brain or head injury. ? Bleeding in the brain. ? A tumor. ? Stroke. ? Brain disorders such as autism  or cerebral palsy.  Low blood sugar.  Conditions that are passed from parent to child (are inherited).  Problems with substances, such as: ? Having a reaction to a drug or a medicine. ? Suddenly stopping the use of a substance (withdrawal). In some cases, the cause may not be known. A person who has repeated seizures over time without a clear cause has a condition called epilepsy. What increases the risk? You are more likely to get this condition if you have:  A family  history of epilepsy.  Had a seizure in the past.  A brain disorder.  A history of head injury, lack of oxygen at birth, or strokes. What are the signs or symptoms? There are many types of seizures. The symptoms vary depending on the type of seizure you have. Examples of symptoms during a seizure include:  Shaking (convulsions).  Stiffness in the body.  Passing out (losing consciousness).  Head nodding.  Staring.  Not responding to sound or touch.  Loss of bladder control and bowel control. Some people have symptoms right before and right after a seizure happens. Symptoms before a seizure may include:  Fear.  Worry (anxiety).  Feeling like you may vomit (nauseous).  Feeling like the room is spinning (vertigo).  Feeling like you saw or heard something before (dj vu).  Odd tastes or smells.  Changes in how you see. You may see flashing lights or spots. Symptoms after a seizure happens can include:  Confusion.  Sleepiness.  Headache.  Weakness on one side of the body. How is this treated? Most seizures will stop on their own in under 5 minutes. In these cases, no treatment is needed. Seizures that last longer than 5 minutes will usually need treatment. Treatment can include:  Medicines given through an IV tube.  Avoiding things that are known to cause your seizures. These can include medicines that you take for another condition.  Medicines to treat epilepsy.  Surgery to stop the seizures. This may be needed if medicines do not help. Follow these instructions at home: Medicines  Take over-the-counter and prescription medicines only as told by your doctor.  Do not eat or drink anything that may keep your medicine from working, such as alcohol. Activity  Do not do any activities that would be dangerous if you had another seizure, like driving or swimming. Wait until your doctor says it is safe for you to do them.  If you live in the [place] ask your local  DM[employer] (department of motor vehicles) when you can drive.  Get plenty of rest. Teaching others Teach friends and family what to do when you have a seizure. They should:  Lay you on the ground.  Protect your head and body.  Loosen any tight clothing around your neck.  Turn you on your side.  Not hold you down.  Not put anything into your mouth.  Know whether or not you need emergency care.  Stay with you until you are better.  General instructions  Contact your doctor each time you have a seizure.  Avoid anything that gives you seizures.  Keep a seizure diary. Write down: ? What you think caused each seizure. ? What you remember about each seizure.  Keep all follow-up visits as told by your doctor. This is important. Contact a doctor if:  You have another seizure.  You have seizures more often.  There is any change in what happens during your seizures.  You keep having seizures with treatment.  You have symptoms of being sick or having an infection. Get help right away if:  You have a seizure that: ? Lasts longer than 5 minutes. ? Is different than seizures you had before. ? Makes it harder to breathe. ? Happens after you hurt your head.  You have any of these symptoms after a seizure: ? Not being able to speak. ? Not being able to use a part of your body. ? Confusion. ? A bad headache.  You have two or more seizures in a row.  You do not wake up right after a seizure.  You get hurt during a seizure. These symptoms may be an emergency. Do not wait to see if the symptoms will go away. Get medical help right away. Call your local emergency services (911 in the [place] Do not drive yourself to the hospital. Summary  Seizures usually last from 30 seconds to 2 minutes. Usually, they are not harmful unless they last a long time.  Do not eat or drink anything that may keep your medicine from working, such as alcohol.  Teach friends and family what to do when  you have a seizure.  Contact your doctor each time you have a seizure. This information is not intended to replace advice given to you by your health care provider. Make sure you discuss any questions you have with your health care provider. Document Revised: 01/31/2019 Document Reviewed: 01/31/2019 Elsevier Patient Education  2020 [employer].

## 2020-10-30 NOTE — Progress Notes (Signed)
Overnight, patient was released from police custody. Personal belongings were brought to her from law enforcement. Patient became upset with family member over the phone concerning "$250,000" being spent out her account and her house being sold without her permission. Stated that she would stay with her aunt who is a Engineer, civil (consulting) when she leaves the hospital.

## 2020-10-30 NOTE — Evaluation (Signed)
Physical Therapy Evaluation Patient Details Name: Mary Hines MRN: [id number] DOB: [birth year] Today's Date: 10/30/2020   History of Present Illness  Pt is a 29 y/o female with a PMH significant for CVA, seizures, pseudoseizures. She presents to the ED with concern for seizures from correctional facility where she has been for 3 days.  Clinical Impression  Pt presents to PT with deficits in functional mobility, gait, balance, cognition, safety awareness. Pt's presentation is inconsistent at times during session with variable use of BUEs. Pt demonstrates good control but mobilizes at reduced speeds for the entire session, similar to her speech. Pt with multiple significant losses of balance this session, one of which required totalA of PT to correct. Pt will benefit from continued acute PT POC to improve mobility quality and to reduce falls risk. PT recommends SNF placement at this time as the pt is at a high risk for falls, has limited caregiver support, and is unable to ascend stairs to reach bedroom and bathroom. If pt elects to discharge home she will benefit from receiving a RW and a manual wheelchair.    Follow Up Recommendations SNF;Supervision/Assistance - 24 hour    Equipment Recommendations  Rolling walker with 5" wheels;Wheelchair (measurements PT)    Recommendations for Other Services       Precautions / Restrictions Precautions Precautions: Fall Restrictions Weight Bearing Restrictions: No      Mobility  Bed Mobility Overal bed mobility: Needs Assistance Bed Mobility: Supine to Sit;Sit to Supine     Supine to sit: Supervision Sit to supine: Supervision   General bed mobility comments: HOB elevated and significant increased time needed. supervision provided for safety    Transfers Overall transfer level: Needs assistance   Transfers: Sit to/from Stand Sit to Stand: Supervision         General transfer comment: increased time, supervision for  safety  Ambulation/Gait Ambulation/Gait assistance: Total assist;Min guard (variable) Gait Distance (Feet): 30 Feet Assistive device: Rolling walker (2 wheeled);None Gait Pattern/deviations: Step-to pattern Gait velocity: reduced Gait velocity interpretation: <1.31 ft/sec, indicative of household ambulator General Gait Details: ot with very slowed step-to gait, initially ambulating without RW. Pt with one lateral LOB toward left side when ambulating which PT corrects with modA. When initiating ambulation with RW pt with significant LOB toward R side PT has to correct with totalA, pt with hip flexion and R posterior lean nearly falling to ground. Pt then ambulates back to bed with minG and UE support of RW  Stairs            Wheelchair Mobility    Modified Rankin (Stroke Patients Only)       Balance Overall balance assessment: Needs assistance Sitting-balance support: No upper extremity supported;Feet supported Sitting balance-Leahy Scale: Fair     Standing balance support: Single extremity supported;Bilateral upper extremity supported Standing balance-Leahy Scale: Poor Standing balance comment: reliant on UE support                             Pertinent Vitals/Pain Pain Assessment: Faces Faces Pain Scale: Hurts little more Pain Location: head Pain Descriptors / Indicators: Headache Pain Intervention(s): Patient requesting pain meds-RN notified    Home Living Family/patient expects to be discharged to:: Private residence Living Arrangements: Other relatives (aunt) Available Help at Discharge: Family;Available PRN/intermittently (possible 24/7 per pt) Type of Home: House Home Access: Level entry     Home Layout: Two level Home Equipment: None  Prior Function Level of Independence: Independent         Comments: pt report being independent up until last few months, since has been moving very slowly, multiple falls     Hand Dominance         Extremity/Trunk Assessment   Upper Extremity Assessment Upper Extremity Assessment: RUE deficits/detail;LUE deficits/detail RUE Deficits / Details: inconsistencies in presentation, pt with functional ROM bilaterally, initially does not utilize RUE during mobility or to don socks, later in session uses RUE to push from bed and through walker LUE Deficits / Details: inconsistencies in presentation, initially utilzing LUE for all activity in bed and to don socks, later in session LUE posturing during gait, having difficulty removing grasp from objects    Lower Extremity Assessment Lower Extremity Assessment: Overall WFL for tasks assessed;RLE deficits/detail RLE Deficits / Details: RLE with very slowed movement but with good control    Cervical / Trunk Assessment Cervical / Trunk Assessment: Normal  Communication   Communication:  (very slowed speech)  Cognition Arousal/Alertness: Awake/alert Behavior During Therapy: Flat affect Overall Cognitive Status: No family/caregiver present to determine baseline cognitive functioning                                 General Comments: pt follows commands well, oriented to month and year but not day, impaired safety awareness, slowed processing      General Comments General comments (skin integrity, edema, etc.): VSS on RA, pt reports HA during session, also reports dizziness which has been fairly constant for the past 3 days    Exercises     Assessment/Plan    PT Assessment Patient needs continued PT services  PT Problem List Decreased strength;Decreased activity tolerance;Decreased balance;Decreased mobility;Decreased cognition;Decreased knowledge of use of DME;Decreased safety awareness;Decreased knowledge of precautions       PT Treatment Interventions DME instruction;Gait training;Stair training;Functional mobility training;Therapeutic activities;Balance training;Therapeutic exercise;Neuromuscular  re-education;Cognitive remediation;Patient/family education    PT Goals (Current goals can be found in the Care Plan section)  Acute Rehab PT Goals Patient Stated Goal: To go home with aunt if her aunt can quit work and provide 24/7 care PT Goal Formulation: With patient Time For Goal Achievement: 11/13/20 Potential to Achieve Goals: Fair    Frequency Min 3X/week   Barriers to discharge Decreased caregiver support;Inaccessible home environment      Co-evaluation               AM-PAC PT "6 Clicks" Mobility  Outcome Measure Help needed turning from your back to your side while in a flat bed without using bedrails?: None Help needed moving from lying on your back to sitting on the side of a flat bed without using bedrails?: A Little Help needed moving to and from a bed to a chair (including a wheelchair)?: A Little Help needed standing up from a chair using your arms (e.g., wheelchair or bedside chair)?: A Little Help needed to walk in hospital room?: A Lot Help needed climbing 3-5 steps with a railing? : A Lot 6 Click Score: 17    End of Session Equipment Utilized During Treatment: Gait belt Activity Tolerance: Patient tolerated treatment well Patient left: in bed;with call bell/phone within reach;with bed alarm set Nurse Communication: Mobility status PT Visit Diagnosis: Unsteadiness on feet (R26.81);Other abnormalities of gait and mobility (R26.89);Other symptoms and signs involving the nervous system (R29.898)    Time: 1107-1200 PT Time Calculation (min) (ACUTE  ONLY): 53 min   Charges:   PT Evaluation $PT Eval Moderate Complexity: 1 Mod PT Treatments $Gait Training: 8-22 mins $Therapeutic Activity: 8-22 mins        Arlyss Gandy, PT, DPT Acute Rehabilitation Pager: [phone]   Arlyss Gandy 10/30/2020, 12:37 PM

## 2020-10-30 NOTE — Progress Notes (Signed)
PROGRESS NOTE    Mary Hines  DGU:[id number] DOB: [birth year] DOA: 10/27/2020 PCP: Patient, No Pcp Per    Brief Narrative:  Mary Hines admitted to the hospital with working diagnosis of seizure-like activity/ psychogenic non epileptic activity.  29 year old female with past medical history of seizures/psychogenic nonepileptic seizures diagnoses inMay 2021. Patient was incarcerated on 10/25/2020,patient was incarcerated after missing court. Same day she was brought to the hospital with seizure-like activity, she was diagnosed with pseudoseizures and discharged back to jail. She was brought back to the ED on 11/30for the same reason and then again12/01. Reported patient having eye twitching and blinking, along with seizure-like activity. On her initial physical examination her blood pressure was 104/69, heart rate 64, respirate 14, oxygen saturation 98%, she had moist mucous membranes, lungs clear to auscultation bilaterally, heart S1-S2, present rhythmic, soft abdomen, no extremity edema. She was somnolent but able to arouse, and answering questions. Sodium 140, potassium 3.6, chloride 106, bicarb 22, glucose 79, BUN 20, creatinine 0.56, white count 6.9, hemoglobin 13.8, hematocrit 41.4, platelets 285. SARS COVID-19 negative. Urinalysis specific gravity 1.028, 30 protein, 6-10 white cells, 0-5 red cells. Head CT with new hypodensity in the right thalamus, possible acute infarct.  Patient was admitted for further work-up. Follow-up MRI negative for acute CVA. Patient has been placed on continuous electroencephalography monitoring to confirm pseudoseizures.  Confirmed non psychogenic nonepileptic seizures, consulted psychiatry.   Patient continue to have nausea and vomiting, poor oral intake and generalized pain.  Poor family support, her mother is a nursing home since 2019 because CVA, and the rest of the family is in [place]. At discharge will go with her  aunt.   Assessment & Plan:   Principal Problem:   Seizure-like activity (HCC) Active Problems:   Seizures (HCC)   Dehydration   Intractable nausea and vomiting   Hypokalemia   Hypomagnesemia   1. Psychogenic non convulsive seizures.  Ruled out for seizures. Continue keppra 100 mg bid (liquid formulation for now) per neurology recommendations. Speech evaluation performed and patient allowed to have a regular diet.    Continue pain control with acetaminophen and ibuprofen, avoid narcotics. Pain is likely nocipalstic and will need further psychiatry follow up as outpatient, opiates not effective and high risk for complications.   Out of bed to chair tid with meals, physical and occupational therapy. Encourage ambulation, follow with PT and OT evaluations.   2. Dehydration/ nausea and vomiting/ hypokalemia and hypomagnesemia.  Continue to have poor oral intake, complains of nausea.  Renal function with stable cr at 0,47, K is 3,5 and bicarbonate is 24.   Stop IV fluids and encourage po intake, continue as needed antiemetics and antiacids.   3. Contrast reaction/ allergy. Post MRI patient had facial erythema that has improved with benadryl, continue close monitoring. No recurrent rash or edema,     Status is: Inpatient  Remains inpatient appropriate because:IV treatments appropriate due to intensity of illness or inability to take PO   Dispo: The patient is from: Home              Anticipated d/c is to: Home              Anticipated d/c date is: 1 day              Patient currently is not medically stable to d/c.   DVT prophylaxis: Enoxaparin   Code Status:   full  Family Communication:  No family at the beside  Consultants:   Neurology   Psychiatry     Subjective: Patient reports generalized pain, very weak and deconditioned, poor oral intake and persistent nausea.   Objective: Vitals:   10/29/20 2328 10/30/20 0337 10/30/20 0401 10/30/20 0829    BP: (!) 95/52 (!) 100/43 97/70 110/73  Pulse: 67 (!) 49 (!) 58 63  Resp: 18 18 16 17   Temp: 98 F (36.7 C) 98 F (36.7 C) 97.7 F (36.5 C) 98.1 F (36.7 C)  TempSrc:  Oral Oral Oral  SpO2: 99% 99% 100% 100%  Weight:      Height:        Intake/Output Summary (Last 24 hours) at 10/30/2020 1007 Last data filed at 10/29/2020 1800 Gross per 24 hour  Intake --  Output 300 ml  Net -300 ml   Filed Weights   10/27/20 0952  Weight: 59 kg    Examination:   General: deconditioned.  Neurology: Awake and alert, opens eyes and continue to have a very slow speech, with low tone.   E ENT: no pallor, no icterus, oral mucosa moist Cardiovascular: No JVD. S1-S2 present, rhythmic, no gallops, rubs, or murmurs. No lower extremity edema. Pulmonary: positive breath sounds bilaterally,  Gastrointestinal. Abdomen soft and non tender Skin. No rashes Musculoskeletal: no joint deformities     Data Reviewed: I have personally reviewed following labs and imaging studies  CBC: Recent Labs  Lab 10/25/20 2121 10/26/20 1551 10/27/20 1103 10/29/20 0137  WBC 7.9 6.5 6.9 6.0  NEUTROABS 6.2 4.9 5.2 3.8  HGB 13.4 13.6 13.8 12.7  HCT 39.1 40.5 41.4 37.2  MCV 86.5 86.4 87.0 84.7  PLT 309 308 285 275   Basic Metabolic Panel: Recent Labs  Lab 10/25/20 2121 10/26/20 1551 10/27/20 1103 10/29/20 0137 10/30/20 0138  NA 138 139 140 140 138  K 3.5 3.7 3.6 3.3* 3.5  CL 104 105 106 109 108  CO2 23 22 22  20* 24  GLUCOSE 103* 103* 79 99 99  BUN 12 11 20 6  <5*  CREATININE 0.53 0.60 0.56 0.55 0.47  CALCIUM 8.9 9.2 8.9 8.8* 8.6*  MG  --   --   --  1.6*  --    GFR: Estimated Creatinine Clearance: 82.1 mL/min (by C-G formula based on SCr of 0.47 mg/dL). Liver Function Tests: Recent Labs  Lab 10/25/20 2121  AST 23  ALT 49*  ALKPHOS 50  BILITOT 0.4  PROT 7.7  ALBUMIN 4.3   No results for input(s): LIPASE, AMYLASE in the last 168 hours. No results for input(s): AMMONIA in the last 168  hours. Coagulation Profile: No results for input(s): INR, PROTIME in the last 168 hours. Cardiac Enzymes: No results for input(s): CKTOTAL, CKMB, CKMBINDEX, TROPONINI in the last 168 hours. BNP (last 3 results) No results for input(s): PROBNP in the last 8760 hours. HbA1C: No results for input(s): HGBA1C in the last 72 hours. CBG: Recent Labs  Lab 10/26/20 1611 10/28/20 0302 10/28/20 0350 10/28/20 1319 10/29/20 0137  GLUCAP 95 56* 141* 82 97   Lipid Profile: No results for input(s): CHOL, HDL, LDLCALC, TRIG, CHOLHDL, LDLDIRECT in the last 72 hours. Thyroid Function Tests: No results for input(s): TSH, T4TOTAL, FREET4, T3FREE, THYROIDAB in the last 72 hours. Anemia Panel: No results for input(s): VITAMINB12, FOLATE, FERRITIN, TIBC, IRON, RETICCTPCT in the last 72 hours.    Radiology Studies: I have reviewed all of the imaging during this hospital visit personally     Scheduled Meds: . enoxaparin (LOVENOX) injection  40 mg Subcutaneous Daily  . levETIRAcetam  1,000 mg Oral BID  . pantoprazole  40 mg Oral Daily   Continuous Infusions:   LOS: 2 days        Delora Gravatt Annett Gula, MD

## 2020-10-30 NOTE — Progress Notes (Signed)
Patient clinically has been improving through the course of the day, improved po intake and no further vomiting.   She has called her family that have come to pick her up, she feels comfortable going home, she will have supervision from her brother and aunt.   Continue outpatient follow up with primary care and psychiatry. I ordered a rolling walker.

## 2020-10-30 NOTE — Plan of Care (Signed)

## 2020-10-31 NOTE — Discharge Summary (Signed)
Physician Discharge Summary  Mary Hines YQM:[id number] DOB: 31-Jan-1991 DOA: 10/27/2020  PCP: Patient, No Pcp Per  Admit date: 10/27/2020 Discharge date: 10/31/2020  Admitted From: Home  Disposition:  Home   Recommendations for Outpatient Follow-up and new medication changes:  1. Follow up with Primary Care in 7 days. 2. Follow up with Psychiatry as outpatient.  3. Patient discharge home under the supervision of her brother and aunt.   Home Health: no   Equipment/Devices: walker   Discharge Condition: stable  CODE STATUS: full  Diet recommendation: regular  Brief/Interim Summary: Mary Hines admitted to the hospital with working diagnosis of seizure-like activity/ psychogenic non epileptic activity.  29 year old female with past medical history of seizures/psychogenic nonepileptic seizures diagnosed inMay 2021. Patient was incarcerated on 10/25/2020, after missing court. This same day she was brought to the hospital with seizure-like activity, she was diagnosed with pseudoseizures and discharged back to jail. She was brought back to the ED on 11/30for the same reason and then again12/01. Reported patient having eye twitching and blinking, along with seizure-like activity. On her initial physical examination her blood pressure was 104/69, heart rate 64, respiratory rate 14, oxygen saturation 98%, she had moist mucous membranes, lungs clear to auscultation bilaterally, heart S1-S2, present rhythmic, soft abdomen, no extremity edema. She was somnolent but able to arouse, and answering questions. Sodium 140, potassium 3.6, chloride 106, bicarb 22, glucose 79, BUN 20, creatinine 0.56, white count 6.9, hemoglobin 13.8, hematocrit 41.4, platelets 285. SARS COVID-19 negative. Urinalysis specific gravity 1.028, 30 protein, 6-10 white cells, 0-5 red cells. Head CT with new hypodensity in the right thalamus, possible acute infarct.  Patient was admitted for further  work-up. Follow-up MRI was negative for acute CVA. Antiepileptic medications were held and patient was placed on continuous electroencephalography monitoring to confirm pseudoseizures.  Confirmed non psychogenic nonepileptic seizures, consulted psychiatry.  Patient continue to have nausea and vomiting, poor oral intake and generalized pain.  Poor family support, her mother is a nursing home since 2019 because CVA, and the rest of the family is in [place]. At discharge will go with her aunt.   Her condition improve, no further non convulsive episodes, tolerated po well, she call her family and she was discharge home.   1.  Psychogenic nonconvulsive seizures.  This diagnosis was confirmed by continuous electroencephalographic monitoring off of antiepileptic agents. Patient was resumed on Keppra 1000 mg twice daily, and psychiatry was consulted.  Patient did not qualify for inpatient psychiatric treatment, she was referred for outpatient management.  Physical therapy evaluation, found inconsistent symptoms.  She will be discharge to home with 24-hour supervision by her brother and aunt.  Ordered rolling walker.  2.  Dehydration, nausea, vomiting, hypokalemia and hypomagnesemia.  Patient received intravenous fluids and her electrolytes were corrected.  At discharge her GI symptoms had resolved and patient tolerating p.o. diet adequately.  3.  Contrast reaction, allergy.  After contrast MRI study patient had flushing and mild facial edema that improved with Benadryl.  Discharge Diagnoses:  Principal Problem:   Seizure-like activity (HCC) Active Problems:   Seizures (HCC)   Dehydration   Intractable nausea and vomiting   Hypokalemia   Hypomagnesemia    Discharge Instructions  Discharge Instructions    Diet - low sodium heart healthy   Complete by: As directed    Discharge instructions   Complete by: As directed    Please follow up with primary care   Increase activity slowly    Complete by:  As directed      Allergies as of 10/30/2020      Reactions   Ivp Dye [iodinated Diagnostic Agents] Hives, Rash   Pharyngitis   Penicillins Hives, Rash   Pharyngitis   Ativan [lorazepam] Hives   Pt said she is not allergic to Ativan on 04/21/20   Shellfish Allergy Hives, Rash   Pharyngitis      Medication List    TAKE these medications   acetaminophen 325 MG tablet Commonly known as: TYLENOL Take 650 mg by mouth every 6 (six) hours as needed for mild pain, fever or headache.   levETIRAcetam 1000 MG tablet Commonly known as: Keppra Take 1 tablet (1,000 mg total) by mouth 2 (two) times daily.       Allergies  Allergen Reactions  . Ivp Dye [Iodinated Diagnostic Agents] Hives and Rash    Pharyngitis  . Penicillins Hives and Rash    Pharyngitis  . Ativan [Lorazepam] Hives    Pt said she is not allergic to Ativan on 04/21/20  . Shellfish Allergy Hives and Rash    Pharyngitis    Consultations:  Neurology   Psychiatry    Procedures/Studies: CT Head Wo Contrast  Result Date: 10/27/2020 CLINICAL DATA:  Seizure. EXAM: CT HEAD WITHOUT CONTRAST TECHNIQUE: Contiguous axial images were obtained from the base of the skull through the vertex without intravenous contrast. COMPARISON:  CT October 26, 2020. FINDINGS: Brain: New hypodensity in the right thalamus, concerning for acute infarct. No acute hemorrhage. No hydrocephalus. No mass lesion or abnormal mass effect. No midline shift. No extra-axial fluid collection Vascular: No hyperdense vessel identified. Skull: No acute fracture. Sinuses/Orbits: Visualized sinuses are clear. Other: No mastoid effusions. IMPRESSION: New hypodensity in the right thalamus, which is concerning for acute infarct. Recommend MRI to further evaluate. Electronically Signed   By: Feliberto Harts MD   On: 10/27/2020 12:52   CT Head Wo Contrast  Result Date: 10/26/2020 CLINICAL DATA:  Multiple seizures including possible pseudo seizure  on route. Medication noncompliance EXAM: CT HEAD WITHOUT CONTRAST TECHNIQUE: Contiguous axial images were obtained from the base of the skull through the vertex without intravenous contrast. COMPARISON:  CT 04/19/2020 FINDINGS: Brain: No evidence of acute infarction, hemorrhage, hydrocephalus, extra-axial collection, visible mass lesion or mass effect. Vascular: No hyperdense vessel or unexpected calcification. Skull: No calvarial fracture or suspicious osseous lesion. No scalp swelling or hematoma. Sinuses/Orbits: Appearance suggesting postsurgical changes from a right mastoidectomy. Correlate with surgical history. Some mild thickening of the right tympanic membrane is similar to prior as well. Paranasal sinuses and mastoid air cells as well as the middle ear cavities are otherwise clear. Included orbital structures are unremarkable. Other: None. IMPRESSION: 1. No acute intracranial findings. Electronically Signed   By: Kreg Shropshire M.D.   On: 10/26/2020 18:55   MR Brain W and Wo Contrast  Result Date: 10/28/2020 CLINICAL DATA:  Possible thalamic stroke on CT, possible seizure EXAM: MRI HEAD WITHOUT AND WITH CONTRAST TECHNIQUE: Multiplanar, multiecho pulse sequences of the brain and surrounding structures were obtained without and with intravenous contrast. CONTRAST:  48mL GADAVIST GADOBUTROL 1 MMOL/ML IV SOLN COMPARISON:  Correlation made with CT head 10/27/2020 FINDINGS: Brain: There is no acute infarction or intracranial hemorrhage. There is no intracranial mass, mass effect, or edema. There is no hydrocephalus or extra-axial fluid collection. Ventricles and sulci are normal in size and configuration. Minimal small foci of T2 hyperintensity in the supratentorial, primarily frontal subcortical white matter likely reflecting nonspecific gliosis/demyelination. Hippocampi  demonstrate symmetric normal size and signal. No abnormal enhancement. Vascular: Major vessel flow voids at the skull base are preserved.  Skull and upper cervical spine: Normal marrow signal is preserved. Sinuses/Orbits: Trace mucosal thickening.  Orbits are unremarkable. Other: Sella is unremarkable.  Trace mastoid fluid opacification. IMPRESSION: CT finding is artifactual.  No acute abnormality. Minimal foci of gliosis/demyelination in the cerebral white matter probably without clinical significance. Electronically Signed   By: Guadlupe Spanish M.D.   On: 10/28/2020 08:41   EEG adult  Result Date: 10/28/2020 Charlsie Quest, MD     10/28/2020 10:42 AM Patient Name: Mary Hines MRN: [id number] Epilepsy Attending: Charlsie Quest Referring Physician/Provider: Dr. Lyda Perone Date: 10/28/2020 Duration: 23.22 minutes Patient history: 29 year old female with seizure-like episodes.  EEG to evaluate for seizures. Level of alertness: Awake AEDs during EEG study: None Technical aspects: This EEG study was done with scalp electrodes positioned according to the 10-20 International system of electrode placement. Electrical activity was acquired at a sampling rate of  and reviewed with a high frequency filter of  and a low frequency filter of . EEG data were recorded continuously and digitally stored. Description: The posterior dominant rhythm consists of 10 Hz activity of moderate voltage (25-35 uV) seen predominantly in posterior head regions, symmetric and reactive to eye opening and eye closing. Physiologic photic driving was seen during photic stimulation.  Hyperventilation was not performed.   IMPRESSION: This study is within normal limits. No seizures or epileptiform discharges were seen throughout the recording. Priyanka Annabelle Harman   Overnight EEG with video  Result Date: 10/29/2020 Charlsie Quest, MD     10/29/2020 11:02 AM Patient Name: Mary Hines MRN: [id number] Epilepsy Attending: Charlsie Quest Referring Physician/Provider: Dr. Milon Dikes Duration: 10/28/2020 0950 to 10/29/2020 1024  Patient history:  29 year old female with seizure-like episodes.  EEG to evaluate for seizures.  Level of alertness: Awake, asleep  AEDs during EEG study: None  Technical aspects: This EEG study was done with scalp electrodes positioned according to the 10-20 International system of electrode placement. Electrical activity was acquired at a sampling rate of  and reviewed with a high frequency filter of  and a low frequency filter of . EEG data were recorded continuously and digitally stored.  Description: The posterior dominant rhythm consists of 10 Hz activity of moderate voltage (25-35 uV) seen predominantly in posterior head regions, symmetric and reactive to eye opening and eye closing. Sleep was characterized by vertex waves, sleep spindles (12-14) hz, maximal frontocentral region. One event was recorded on 10/28/2020 at 2013, on 10/29/2020 at 0025. Patient was laying in bed with eyes closes, non rhythmic facial twitching/grimacing. Concomitant eeg before, during and after the event didn't show any eeg change to suggest seizure. One event was recorded on 10/29/2020 at 031 and 0848. Patient was laying in bed and had repetitive eye blinking. Concomitant eeg before, during and after the event didn't show any eeg change to suggest seizure.  IMPRESSION: This study is within normal limits. No seizures or epileptiform discharges were seen throughout the recording. Four events were recorded as described above with facial twitching/grimacing and repetitive eye blinking without concomitant eeg change and were NON epileptic.  Priyanka Annabelle Harman       Subjective: Patient is feeling better, her speech and po intake have improved, no nausea or vomiting, no dyspnea or chest pain.   Discharge Exam: Vitals:   10/30/20 1115 10/30/20 1553  BP: 116/86 117/75  Pulse:  73 66  Resp: 18 18  Temp: 98.3 F (36.8 C) 99 F (37.2 C)  SpO2: 100% 100%   Vitals:   10/30/20 0401 10/30/20 0829 10/30/20 1115 10/30/20 1553  BP:  97/70 110/73 116/86 117/75  Pulse: (!) 58 63 73 66  Resp: 16 17 18 18   Temp: 97.7 F (36.5 C) 98.1 F (36.7 C) 98.3 F (36.8 C) 99 F (37.2 C)  TempSrc: Oral Oral Oral Oral  SpO2: 100% 100% 100% 100%  Weight:      Height:        General: Not in pain or dyspnea.  Neurology: Awake and alert, non focal  E ENT: no pallor, no icterus, oral mucosa moist Cardiovascular: No JVD. S1-S2 present, rhythmic, no gallops, rubs, or murmurs. No lower extremity edema. Pulmonary: positive breath sounds bilaterally. Gastrointestinal. Abdomen soft and non tender Skin. No rashes Musculoskeletal: no joint deformities   The results of significant diagnostics from this hospitalization (including imaging, microbiology, ancillary and laboratory) are listed below for reference.     Microbiology: Recent Results (from the past 240 hour(s))  Resp Panel by RT-PCR (Flu A&B, Covid) Nasopharyngeal Swab     Status: None   Collection Time: 10/27/20  1:06 PM   Specimen: Nasopharyngeal Swab; Nasopharyngeal(NP) swabs in vial transport medium  Result Value Ref Range Status   SARS Coronavirus 2 by RT PCR NEGATIVE NEGATIVE Final    Comment: (NOTE) SARS-CoV-2 target nucleic acids are NOT DETECTED.  The SARS-CoV-2 RNA is generally detectable in upper respiratory specimens during the acute phase of infection. The lowest concentration of SARS-CoV-2 viral copies this assay can detect is 138 copies/mL. A negative result does not preclude SARS-Cov-2 infection and should not be used as the sole basis for treatment or other patient management decisions. A negative result may occur with  improper specimen collection/handling, submission of specimen other than nasopharyngeal swab, presence of viral mutation(s) within the areas targeted by this assay, and inadequate number of viral copies(<138 copies/mL). A negative result must be combined with clinical observations, patient history, and epidemiological information. The  expected result is Negative.  Fact Sheet for Patients:  BloggerCourse.comhttps://www.fda.gov/media/152166/download  Fact Sheet for Healthcare Providers:  SeriousBroker.ithttps://www.fda.gov/media/152162/download  This test is no t yet approved or cleared by the [place] FDA and  has been authorized for detection and/or diagnosis of SARS-CoV-2 by FDA under an Emergency Use Authorization (EUA). This EUA will remain  in effect (meaning this test can be used) for the duration of the COVID-19 declaration under Section 564(b)(1) of the Act, 21 U.S.C.section 360bbb-3(b)(1), unless the authorization is terminated  or revoked sooner.       Influenza A by PCR NEGATIVE NEGATIVE Final   Influenza B by PCR NEGATIVE NEGATIVE Final    Comment: (NOTE) The Xpert Xpress SARS-CoV-2/FLU/RSV plus assay is intended as an aid in the diagnosis of influenza from Nasopharyngeal swab specimens and should not be used as a sole basis for treatment. Nasal washings and aspirates are unacceptable for Xpert Xpress SARS-CoV-2/FLU/RSV testing.  Fact Sheet for Patients: BloggerCourse.comhttps://www.fda.gov/media/152166/download  Fact Sheet for Healthcare Providers: SeriousBroker.ithttps://www.fda.gov/media/152162/download  This test is not yet approved or cleared by the [place] FDA and has been authorized for detection and/or diagnosis of SARS-CoV-2 by FDA under an Emergency Use Authorization (EUA). This EUA will remain in effect (meaning this test can be used) for the duration of the COVID-19 declaration under Section 564(b)(1) of the Act, 21 U.S.C. section 360bbb-3(b)(1), unless the authorization is terminated or revoked.  Performed  at [facility], [address], [place], [place] [address]   Culture, Urine     Status: Abnormal   Collection Time: 10/28/20  2:06 AM   Specimen: Urine, Random  Result Value Ref Range Status   Specimen Description URINE, RANDOM  Final   Special Requests   Final    NONE Performed at [facility]  Lab, [address] [place], [place] [address]    Culture MULTIPLE SPECIES PRESENT, SUGGEST RECOLLECTION (A)  Final   Report Status 10/29/2020 FINAL  Final     Labs: BNP (last 3 results) No results for input(s): BNP in the last 8760 hours. Basic Metabolic Panel: Recent Labs  Lab 10/25/20 2121 10/26/20 1551 10/27/20 1103 10/29/20 0137 10/30/20 0138  NA 138 139 140 140 138  K 3.5 3.7 3.6 3.3* 3.5  CL 104 105 106 109 108  CO2 20* 24  GLUCOSE 103* 103* 79 99 99  BUN <5*  CREATININE 0.53 0.60 0.56 0.55 0.47  CALCIUM 8.9 9.2 8.9 8.8* 8.6*  MG  --   --   --  1.6*  --    Liver Function Tests: Recent Labs  Lab 10/25/20 2121  AST 23  ALT 49*  ALKPHOS 50  BILITOT 0.4  PROT 7.7  ALBUMIN 4.3   No results for input(s): LIPASE, AMYLASE in the last 168 hours. No results for input(s): AMMONIA in the last 168 hours. CBC: Recent Labs  Lab 10/25/20 2121 10/26/20 1551 10/27/20 1103 10/29/20 0137  WBC 7.9 6.5 6.9 6.0  NEUTROABS 6.2 4.9 5.2 3.8  HGB 13.4 13.6 13.8 12.7  HCT 39.1 40.5 41.4 37.2  MCV 86.5 86.4 87.0 84.7  PLT 309 308 285 275   Cardiac Enzymes: No results for input(s): CKTOTAL, CKMB, CKMBINDEX, TROPONINI in the last 168 hours. BNP: Invalid input(s): POCBNP CBG: Recent Labs  Lab 10/26/20 1611 10/28/20 0302 10/28/20 0350 10/28/20 1319 10/29/20 0137  GLUCAP 95 56* 141* 82 97   D-Dimer No results for input(s): DDIMER in the last 72 hours. Hgb A1c No results for input(s): HGBA1C in the last 72 hours. Lipid Profile No results for input(s): CHOL, HDL, LDLCALC, TRIG, CHOLHDL, LDLDIRECT in the last 72 hours. Thyroid function studies No results for input(s): TSH, T4TOTAL, T3FREE, THYROIDAB in the last 72 hours.  Invalid input(s): FREET3 Anemia work up No results for input(s): VITAMINB12, FOLATE, FERRITIN, TIBC, IRON, RETICCTPCT in the last 72 hours. Urinalysis    Component Value Date/Time   COLORURINE YELLOW 10/27/2020 2230    APPEARANCEUR HAZY (A) 10/27/2020 2230   LABSPEC 1.028 10/27/2020 2230   PHURINE 6.0 10/27/2020 2230   GLUCOSEU NEGATIVE 10/27/2020 2230   HGBUR NEGATIVE 10/27/2020 2230   BILIRUBINUR NEGATIVE 10/27/2020 2230   KETONESUR 80 (A) 10/27/2020 2230   PROTEINUR 30 (A) 10/27/2020 2230   NITRITE NEGATIVE 10/27/2020 2230   LEUKOCYTESUR MODERATE (A) 10/27/2020 2230   Sepsis Labs Invalid input(s): PROCALCITONIN,  WBC,  LACTICIDVEN Microbiology Recent Results (from the past 240 hour(s))  Resp Panel by RT-PCR (Flu A&B, Covid) Nasopharyngeal Swab     Status: None   Collection Time: 10/27/20  1:06 PM   Specimen: Nasopharyngeal Swab; Nasopharyngeal(NP) swabs in vial transport medium  Result Value Ref Range Status   SARS Coronavirus 2 by RT PCR NEGATIVE NEGATIVE Final    Comment: (NOTE) SARS-CoV-2 target nucleic acids are NOT DETECTED.  The SARS-CoV-2 RNA is generally detectable in upper respiratory specimens during the acute  phase of infection. The lowest concentration of SARS-CoV-2 viral copies this assay can detect is 138 copies/mL. A negative result does not preclude SARS-Cov-2 infection and should not be used as the sole basis for treatment or other patient management decisions. A negative result may occur with  improper specimen collection/handling, submission of specimen other than nasopharyngeal swab, presence of viral mutation(s) within the areas targeted by this assay, and inadequate number of viral copies(<138 copies/mL). A negative result must be combined with clinical observations, patient history, and epidemiological information. The expected result is Negative.  Fact Sheet for Patients:  BloggerCourse.com  Fact Sheet for Healthcare Providers:  SeriousBroker.it  This test is no t yet approved or cleared by the [place] FDA and  has been authorized for detection and/or diagnosis of SARS-CoV-2 by FDA under an Emergency Use  Authorization (EUA). This EUA will remain  in effect (meaning this test can be used) for the duration of the COVID-19 declaration under Section 564(b)(1) of the Act, 21 U.S.C.section 360bbb-3(b)(1), unless the authorization is terminated  or revoked sooner.       Influenza A by PCR NEGATIVE NEGATIVE Final   Influenza B by PCR NEGATIVE NEGATIVE Final    Comment: (NOTE) The Xpert Xpress SARS-CoV-2/FLU/RSV plus assay is intended as an aid in the diagnosis of influenza from Nasopharyngeal swab specimens and should not be used as a sole basis for treatment. Nasal washings and aspirates are unacceptable for Xpert Xpress SARS-CoV-2/FLU/RSV testing.  Fact Sheet for Patients: BloggerCourse.com  Fact Sheet for Healthcare Providers: SeriousBroker.it  This test is not yet approved or cleared by the [place] FDA and has been authorized for detection and/or diagnosis of SARS-CoV-2 by FDA under an Emergency Use Authorization (EUA). This EUA will remain in effect (meaning this test can be used) for the duration of the COVID-19 declaration under Section 564(b)(1) of the Act, 21 U.S.C. section 360bbb-3(b)(1), unless the authorization is terminated or revoked.  Performed at [facility] [address], [place], [place] [address]   Culture, Urine     Status: Abnormal   Collection Time: 10/28/20  2:06 AM   Specimen: Urine, Random  Result Value Ref Range Status   Specimen Description URINE, RANDOM  Final   Special Requests   Final    NONE Performed at [facility], [address] [place], [place] [address]    Culture MULTIPLE SPECIES PRESENT, SUGGEST RECOLLECTION (A)  Final   Report Status 10/29/2020 FINAL  Final     Time coordinating discharge: 45 minutes  SIGNED:   Coralie Keens, MD  [facility] 10/31/2020, 8:23 AM

## 2020-11-10 ENCOUNTER — Encounter (HOSPITAL_COMMUNITY): Payer: Self-pay | Admitting: Radiology

## 2021-11-14 IMAGING — MR MR HEAD WO/W CM
12 of 13 series · 43 of 48 positions shown · IV contrast (gadavist)
Comparison: Correlation made with CT head 10/27/2020

CLINICAL DATA: Possible thalamic stroke on CT, possible seizure

EXAM:
MRI HEAD WITHOUT AND WITH CONTRAST
TECHNIQUE: Multiplanar, multiecho pulse sequences of the brain and surrounding
structures were obtained without and with intravenous contrast.
CONTRAST:  6mL GADAVIST GADOBUTROL 1 MMOL/ML IV SOLN

[Series 5: DWI · axial · 3.0mm · 0.88mm/px · z∈[-81,+65]mm · 7 of 100 slices shown (1 of 4)]
[im 1/100]
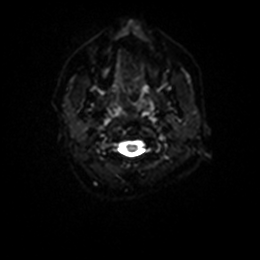
[im 17/100]
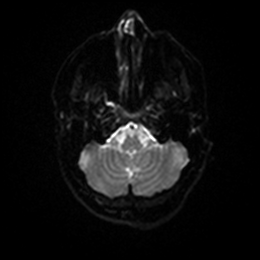
[im 34/100]
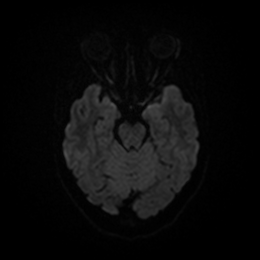
[im 50/100]
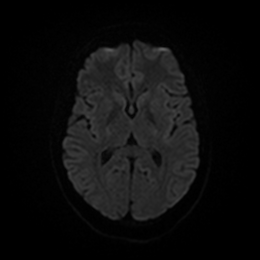
[im 67/100]
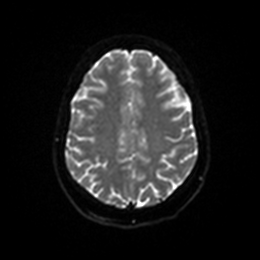
[im 83/100]
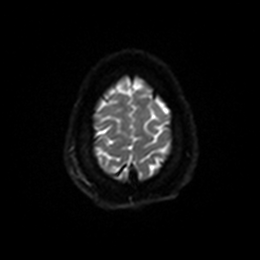
[im 100/100]
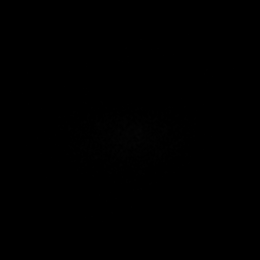

[Series 6: DWI · axial · 3.0mm · 0.88mm/px · z∈[-81,+65]mm · 4 of 50 slices shown (2 of 4)]
[im 1/50]
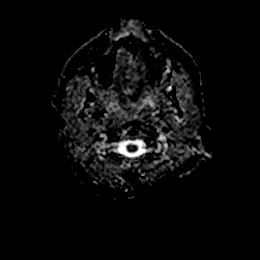
[im 17/50]
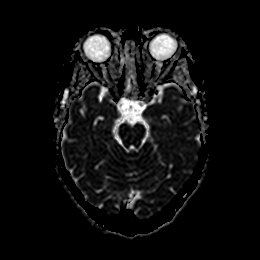
[im 33/50]
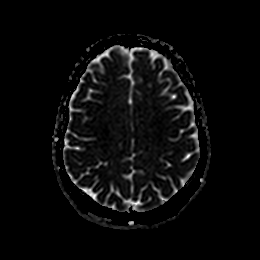
[im 50/50]
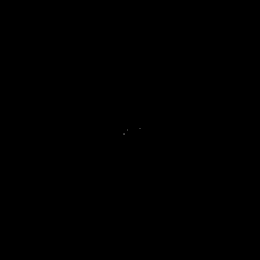

[Series 7: DWI · coronal · 4.0mm · 0.88mm/px · 5 of 64 slices shown (3 of 4)]
[im 1/64]
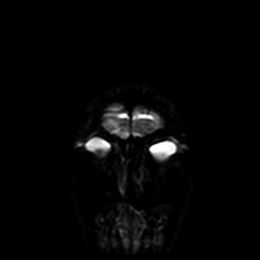
[im 16/64]
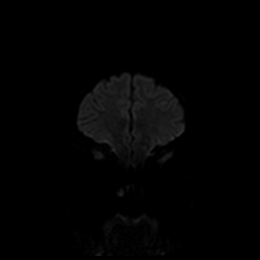
[im 32/64]
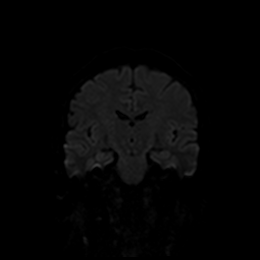
[im 48/64]
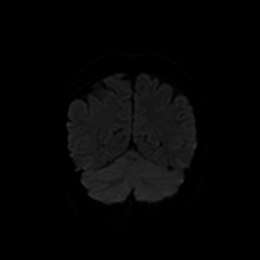
[im 64/64]
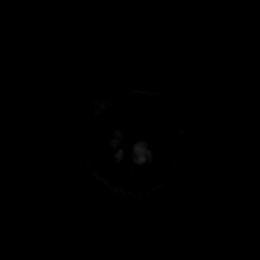

[Series 8: DWI · coronal · 4.0mm · 0.88mm/px · 3 of 32 slices shown (4 of 4)]
[im 1/32]
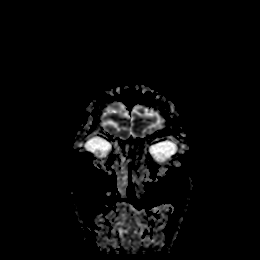
[im 16/32]
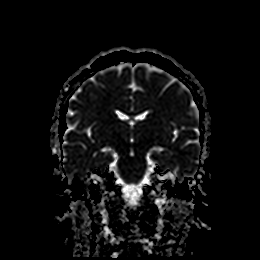
[im 32/32]
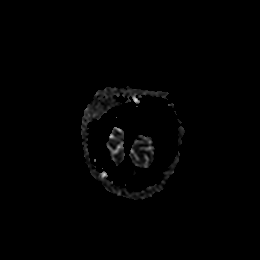

[Series 10: T2 · axial · 5.0mm · 0.72mm/px · z∈[-85,+70]mm · 2 of 27 slices shown (1 of 2)]
[im 1/27]
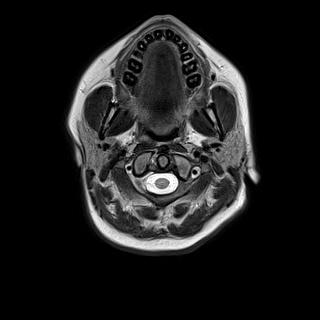
[im 27/27]
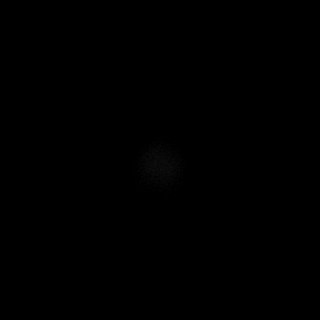

[Series 11: FLAIR · axial · 5.0mm · 0.45mm/px · z∈[-84,+71]mm · 2 of 27 slices shown]
[im 1/27]
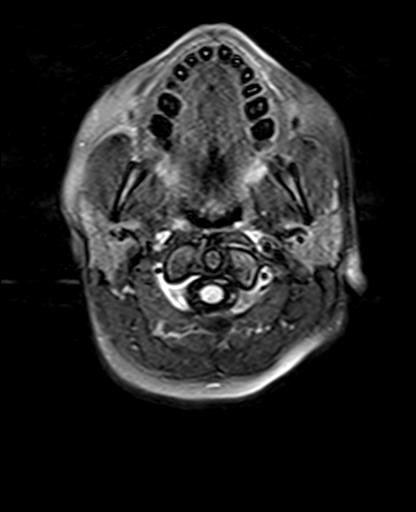
[im 27/27]
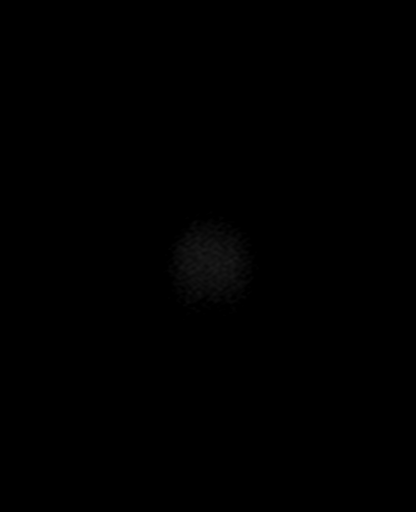

[Series 12: mag_images · axial · 3.0mm · 0.90mm/px · z∈[-95,+81]mm · 5 of 60 slices shown]
[im 1/60]
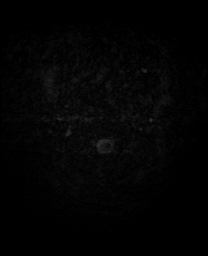
[im 15/60]
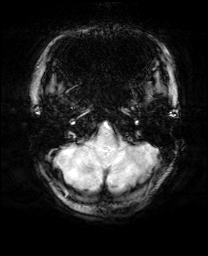
[im 30/60]
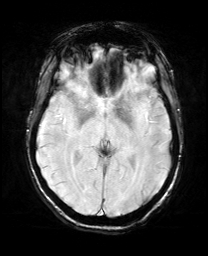
[im 45/60]
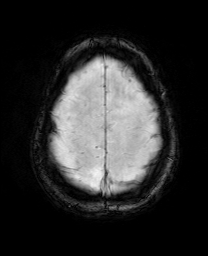
[im 60/60]
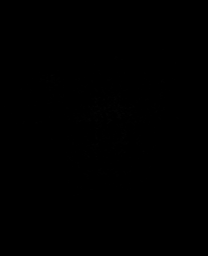

[Series 13: pha_images · axial · 3.0mm · 0.90mm/px · z∈[-92,+72]mm · 4 of 55 slices shown]
[im 1/55]
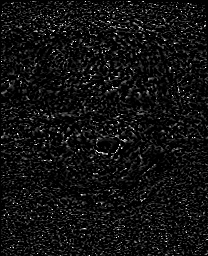
[im 19/55]
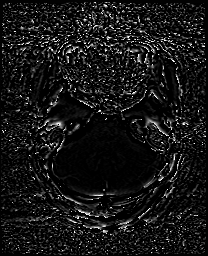
[im 37/55]
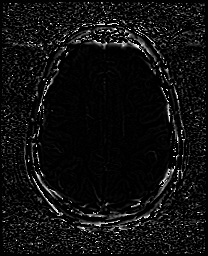
[im 55/55]
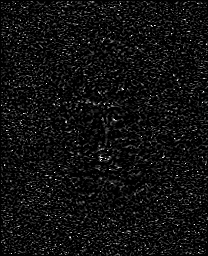

[Series 15: mip_images(sw) · axial · 24.0mm · 0.90mm/px · z∈[-84,+71]mm · 4 of 53 slices shown]
[im 1/53]
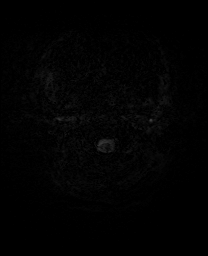
[im 18/53]
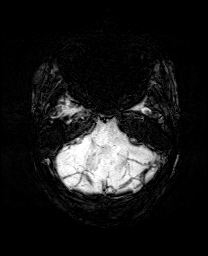
[im 35/53]
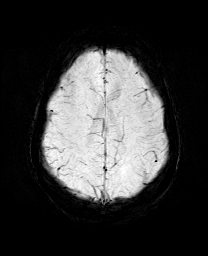
[im 53/53]
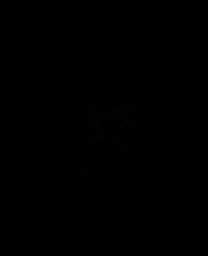

[Series 17: T2 · coronal · 3.0mm · 0.27mm/px · 3 of 32 slices shown (2 of 2)]
[im 1/32]
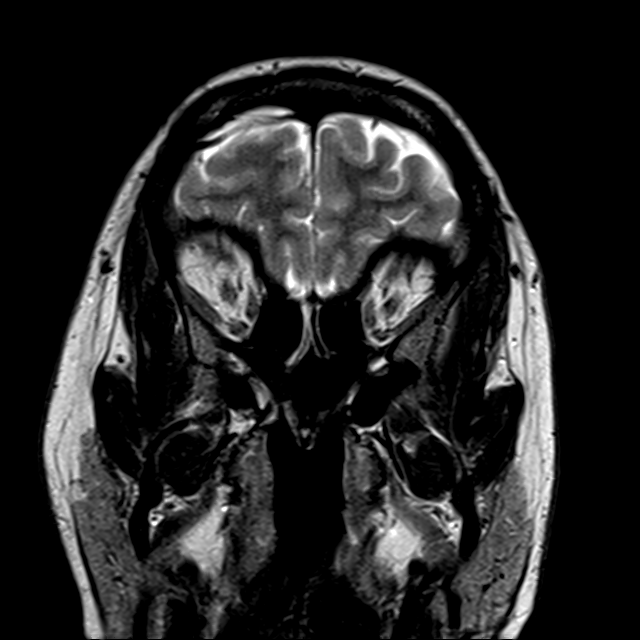
[im 16/32]
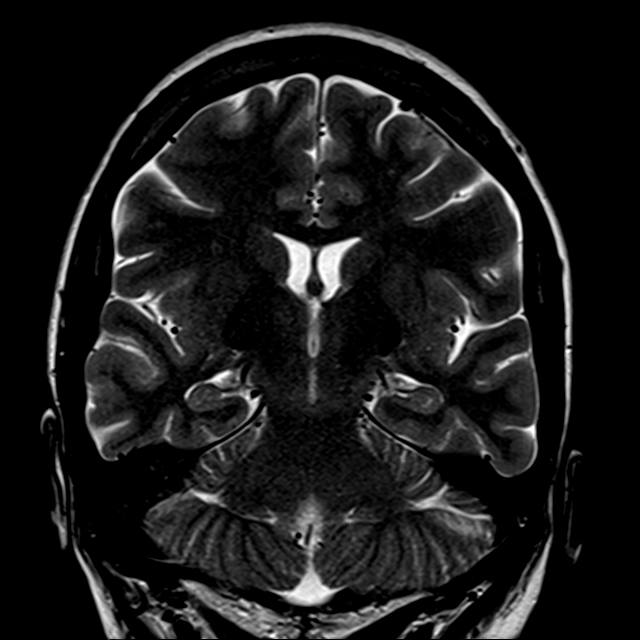
[im 32/32]
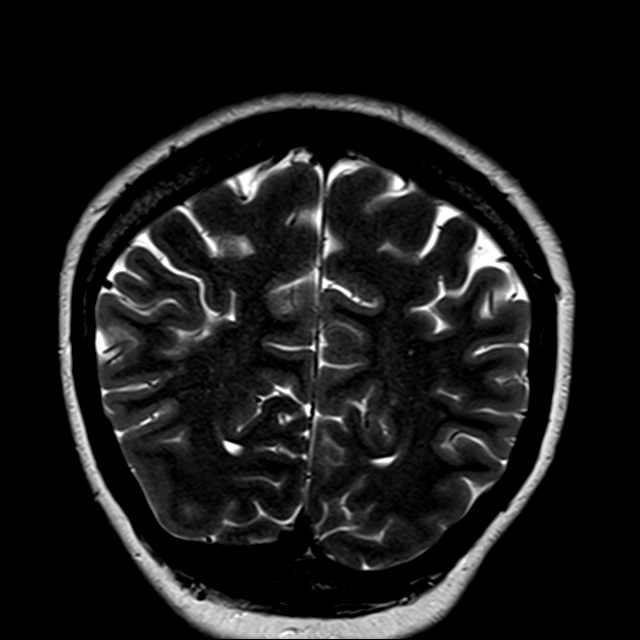

[Series 19: T2 post-contrast · coronal · 5.0mm · 0.72mm/px · 2 of 28 slices shown]
[im 1/28]
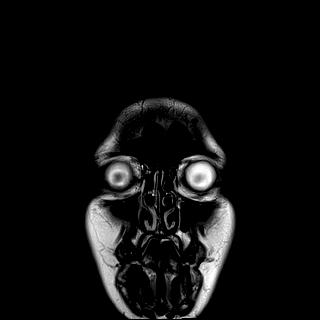
[im 28/28]
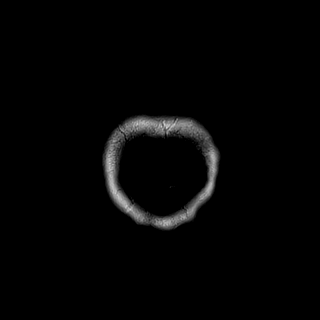

[Series 22: T1 post-contrast · sagittal · 5.0mm · 0.72mm/px · 2 of 23 slices shown]
[im 1/23]
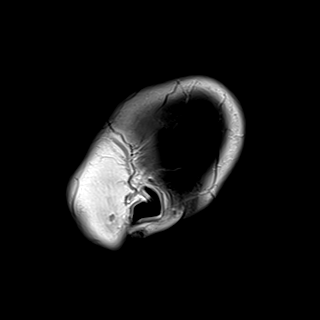
[im 23/23]
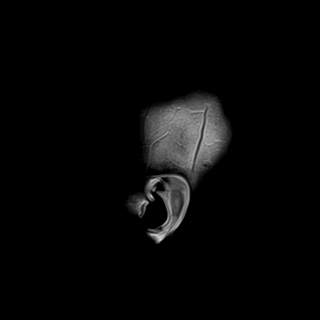

[43 of 48 positions shown; findings below may reference images not displayed]

FINDINGS: Brain: There is no acute infarction or intracranial hemorrhage.
There is no intracranial mass, mass effect, or edema. There is no
hydrocephalus or extra-axial fluid collection. Ventricles and sulci
are normal in size and configuration. Minimal small foci of T2
hyperintensity in the supratentorial, primarily frontal subcortical
white matter likely reflecting nonspecific gliosis/demyelination.
Hippocampi demonstrate symmetric normal size and signal. No abnormal
enhancement.

Vascular: Major vessel flow voids at the skull base are preserved.

Skull and upper cervical spine: Normal marrow signal is preserved.

Sinuses/Orbits: Trace mucosal thickening.  Orbits are unremarkable.

Other: Sella is unremarkable.  Trace mastoid fluid opacification.
IMPRESSION: CT finding is artifactual.  No acute abnormality.

Minimal foci of gliosis/demyelination in the cerebral white matter
probably without clinical significance.
